# Patient Record
Sex: Male | Born: 1995 | Race: White | Hispanic: No | Marital: Single | Smoking: Current every day smoker
Health system: Southern US, Community
[De-identification: ages and names within clinical notes are randomized; demographics above are authoritative.]

## PROBLEM LIST (undated history)

## (undated) DIAGNOSIS — J45909 Unspecified asthma, uncomplicated: Secondary | ICD-10-CM

## (undated) DIAGNOSIS — R569 Unspecified convulsions: Secondary | ICD-10-CM

## (undated) HISTORY — DX: Unspecified convulsions: R56.9

---
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
## 2014-11-25 ENCOUNTER — Emergency Department (HOSPITAL_COMMUNITY)
Admission: EM | Admit: 2014-11-25 | Discharge: 2014-11-25 | Disposition: A | Discharge status: Home / Self Care | Payer: Self-pay | Attending: Emergency Medicine | Admitting: Emergency Medicine

## 2014-11-25 ENCOUNTER — Emergency Department (HOSPITAL_COMMUNITY): Payer: Self-pay

## 2014-11-25 ENCOUNTER — Encounter (HOSPITAL_COMMUNITY): Payer: Self-pay | Admitting: Emergency Medicine

## 2014-11-25 DIAGNOSIS — R0981 Nasal congestion: Secondary | ICD-10-CM

## 2014-11-25 DIAGNOSIS — J45909 Unspecified asthma, uncomplicated: Secondary | ICD-10-CM | POA: Insufficient documentation

## 2014-11-25 DIAGNOSIS — Y999 Unspecified external cause status: Secondary | ICD-10-CM | POA: Insufficient documentation

## 2014-11-25 DIAGNOSIS — R600 Localized edema: Secondary | ICD-10-CM | POA: Insufficient documentation

## 2014-11-25 DIAGNOSIS — Y9389 Activity, other specified: Secondary | ICD-10-CM | POA: Insufficient documentation

## 2014-11-25 DIAGNOSIS — X58XXXA Exposure to other specified factors, initial encounter: Secondary | ICD-10-CM | POA: Insufficient documentation

## 2014-11-25 DIAGNOSIS — J029 Acute pharyngitis, unspecified: Secondary | ICD-10-CM | POA: Insufficient documentation

## 2014-11-25 DIAGNOSIS — T171XXA Foreign body in nostril, initial encounter: Secondary | ICD-10-CM

## 2014-11-25 DIAGNOSIS — Y9289 Other specified places as the place of occurrence of the external cause: Secondary | ICD-10-CM | POA: Insufficient documentation

## 2014-11-25 HISTORY — DX: Unspecified asthma, uncomplicated: J45.909

## 2014-11-25 LAB — RAPID STREP SCREEN (MED CTR MEBANE ONLY): STREPTOCOCCUS, GROUP A SCREEN (DIRECT): NEGATIVE

## 2014-11-25 NOTE — Discharge Instructions (Signed)
Nasal Foreign Body A nasal foreign body is any object inserted inside the nose. Small children often insert small objects in the nose such as beads, coins, and small toys. Older children and adults may also accidentally get an object stuck inside the nose. Having a foreign body in the nose can cause serious medical problems. It may cause trouble breathing. If the object is swallowed and obstructs the esophagus, it can cause difficulty swallowing. A nasal foreign body often causes bleeding of the nose. Depending on the type of object, irritation in the nose may also occur. This can be more serious with certain objects, such as button batteries, magnets, and wooden objects. A foreign body may also cause thick, yellowish, or bad smelling drainage from the nose, as well as pain in the nose and face. These problems can be signs of infection. Nasal foreign bodies require immediate evaluation by a medical professional.  HOME CARE INSTRUCTIONS   Do not try to remove the object without getting medical advice. Trying to grab the object may push it deeper and make it more difficult to remove.  Breathe through the mouth until you can see your caregiver. This helps prevent inhalation of the object.  Keep small objects out of reach of young children.  Tell your child not to put objects into his or her nose. Tell your child to get help from an adult right away if it happens again. SEEK MEDICAL CARE IF:   There is any trouble breathing.  There is sudden difficulty swallowing, increased drooling, or new chest pain.  There is any bleeding from the nose.  The nose continues to drain. An object may still be in the nose.  A fever, earache, headache, pain in the cheeks or around the eyes, or yellow-green nasal discharge develops. These are signs of a possible sinus infection or ear infection from obstruction of the normal nasal airway. MAKE SURE YOU:  Understand these instructions.  Will watch your  condition.  Will get help right away if you are not doing well or get worse.   This information is not intended to replace advice given to you by your health care provider. Make sure you discuss any questions you have with your health care provider.   Follow up with the Ear, Nose and Throat doctor as soon as possible for evaluation of possible foreign body in your nose. Continue taking OTC allergy medication. Encourage nasal rinsing, netty pot use. Return to the Emergency Department if you experience severe nose bleeding, difficulty breathing, confusion, fever, difficulty swallowing, blood in vomit or stool.

## 2014-11-25 NOTE — ED Provider Notes (Signed)
CSN: [id number]     Arrival date & time 11/25/14  1519 History   First MD Initiated Contact with Patient 11/25/14 1805     Chief Complaint  Patient presents with  . Facial Swelling    The patient said he started having congestion sunday morning and he sneezed and a "long string" came out.  He also said his face is swollen.  He said he googled it and he found out  that the "long strings" might be a parasite.  . Nasal Congestion     (Consider location/radiation/quality/duration/timing/severity/associated sxs/prior Treatment) HPI  Eric Arellano is a 19 y.o M with a pmhx of hay fever who presents to the Emergency Department c/o a foreign body in his nose. Pt states that 5 days ago he began sneezing and experiencing congestion. Pt was taking OTC allergy medication with no relief. Pt states that when he blows his nose a "long string" came out. Pt states that he looked it up on google and he thinks it may be a parasite. Pt states he has seen approximately 20 of these strings come out of his nose. The strings are clear, they do not move. Denies recent travel, drinking unfiltered water, vomiting, diarrhea, melena, hematichezia, dysuria, cough, SOB, headache, blurry vision, fever.  Past Medical History  Diagnosis Date  . Asthma    History reviewed. No pertinent past surgical history. History reviewed. No pertinent family history. Social History  Substance Use Topics  . Smoking status: Never Smoker   . Smokeless tobacco: None  . Alcohol Use: No    Review of Systems  All other systems reviewed and are negative.     Allergies  Review of patient's allergies indicates not on file.  Home Medications   Prior to Admission medications   Not on File   BP 135/90 mmHg  Pulse 66  Temp(Src) 98.2 F (36.8 C) (Oral)  Resp 16  Ht  (1.651 m)  Wt 150 lb (68.04 kg)  BMI 24.96 kg/m2  SpO2 98% Physical Exam  Constitutional: He is oriented to person, place, and time. He appears well-developed  and well-nourished. No distress.  HENT:  Head: Normocephalic and atraumatic.  Nose: Mucosal edema and rhinorrhea present. No sinus tenderness or nasal deformity. No epistaxis.  No foreign bodies.  Mouth/Throat: Oropharynx is clear and moist. No oropharyngeal exudate.  No evidence of foreign body seen on clinical exam. Nares boggy, but patent.  Eyes: Conjunctivae are normal. Right eye exhibits no discharge. Left eye exhibits no discharge. No scleral icterus.  Neck: Neck supple.  Cardiovascular: Normal rate and normal heart sounds.  Exam reveals no gallop and no friction rub.   No murmur heard. Pulmonary/Chest: Effort normal and breath sounds normal. No respiratory distress. He has no wheezes. He has no rales. He exhibits no tenderness.  Lymphadenopathy:    He has no cervical adenopathy.  Neurological: He is alert and oriented to person, place, and time. Coordination normal.  Skin: Skin is warm and dry. No rash noted. He is not diaphoretic. No erythema. No pallor.  Psychiatric: He has a normal mood and affect. His behavior is normal.  Nursing note and vitals reviewed.   ED Course  Procedures (including critical care time) Labs Review Labs Reviewed  RAPID STREP SCREEN (NOT AT [facility])  CULTURE, GROUP A STREP    Imaging Review Dg Chest 2 View  11/25/2014  CLINICAL DATA:  Fever and congestion for 3 days EXAM: CHEST  2 VIEW COMPARISON:  None. FINDINGS: Lungs are  clear. Heart size and pulmonary vascularity are normal. No adenopathy. No bone lesions. IMPRESSION: No abnormality noted. Electronically Signed   By: Bretta BangWilliam  Woodruff III M.D.   On: 11/25/2014 16:10   I have personally reviewed and evaluated these images and lab results as part of my medical decision-making.   EKG Interpretation None      MDM   Final diagnoses:  Nasal congestion  Foreign body in nose, initial encounter   Otherwise healthy 19 y.o M presents with congestion, possible foreign body in nose. Pt having  allergy-like symptoms including congestion and sneezing. When blowing his nose patient noticed a long string on his tissue. Patient concerned that this may be a parasite. Patient took a photo of what came out of his nose. I reviewed the photo and the foreign body in question appears to be mucus. No evidence of foreign body seen on clinical exam. We'll refer patient to ENT for follow-up evaluation. Patient encouraged to continue taking over-the-counter decongestants and nasal rinsing.   Return precautions outlined in patient discharge instructions.      Lester KinsmanSamantha Tripp DarrowDowless, PA-C 11/26/14 [id number]  Azalia BilisKevin Campos, MD 11/26/14 0030

## 2014-11-25 NOTE — ED Notes (Signed)
The patient said he started having congestion sunday morning and he sneezed and a "long string" came out.  He also said his face is swollen.  He said he googled it and he found out  that the "long strings" might be a parasite.  He is complaining of a sore throat.  He did take some allergy medication and it is not working.

## 2014-11-27 LAB — CULTURE, GROUP A STREP: Strep A Culture: NEGATIVE

## 2014-12-15 ENCOUNTER — Emergency Department (HOSPITAL_COMMUNITY)
Admission: EM | Admit: 2014-12-15 | Discharge: 2014-12-15 | Disposition: A | Discharge status: Home / Self Care | Payer: Self-pay | Attending: Physician Assistant | Admitting: Physician Assistant

## 2014-12-15 ENCOUNTER — Encounter (HOSPITAL_COMMUNITY): Payer: Self-pay

## 2014-12-15 DIAGNOSIS — Y9289 Other specified places as the place of occurrence of the external cause: Secondary | ICD-10-CM | POA: Insufficient documentation

## 2014-12-15 DIAGNOSIS — X58XXXA Exposure to other specified factors, initial encounter: Secondary | ICD-10-CM | POA: Insufficient documentation

## 2014-12-15 DIAGNOSIS — Y998 Other external cause status: Secondary | ICD-10-CM | POA: Insufficient documentation

## 2014-12-15 DIAGNOSIS — Y9389 Activity, other specified: Secondary | ICD-10-CM | POA: Insufficient documentation

## 2014-12-15 DIAGNOSIS — S39012A Strain of muscle, fascia and tendon of lower back, initial encounter: Secondary | ICD-10-CM | POA: Insufficient documentation

## 2014-12-15 DIAGNOSIS — J45909 Unspecified asthma, uncomplicated: Secondary | ICD-10-CM | POA: Insufficient documentation

## 2014-12-15 DIAGNOSIS — F17210 Nicotine dependence, cigarettes, uncomplicated: Secondary | ICD-10-CM | POA: Insufficient documentation

## 2014-12-15 MED ORDER — MELOXICAM 15 MG PO TABS: 15.0000 mg | ORAL_TABLET | Freq: Every day | ORAL | Status: DC

## 2014-12-15 MED ORDER — NAPROXEN 250 MG PO TABS
500.0000 mg | ORAL_TABLET | Freq: Once | ORAL | Status: AC
Start: 2014-12-15 — End: 2014-12-15
  Administered 2014-12-15: 500 mg via ORAL
  Filled 2014-12-15: qty 2

## 2014-12-15 MED ORDER — CYCLOBENZAPRINE HCL 10 MG PO TABS: 10.0000 mg | ORAL_TABLET | Freq: Two times a day (BID) | ORAL | Status: DC | PRN

## 2014-12-15 MED ORDER — CYCLOBENZAPRINE HCL 10 MG PO TABS
10.0000 mg | ORAL_TABLET | Freq: Once | ORAL | Status: AC
  Administered 2014-12-15: 10 mg via ORAL
  Filled 2014-12-15: qty 1

## 2014-12-15 NOTE — Discharge Instructions (Signed)
Take mobic and and flexeril as needed for pain. Refer to attached documents for more information. Return to the ED with worsening or concerning symptoms.

## 2014-12-15 NOTE — ED Notes (Signed)
Per Pt, Patient was leaving work last night and bent over when he then had sudden pain and was unable to stand back up. Patient denies any pain in spine, arms and legs. Denies any nausea, vomiting, diarrhea.

## 2014-12-15 NOTE — ED Notes (Signed)
See PA Assessment  

## 2014-12-15 NOTE — ED Provider Notes (Signed)
CSN: [id number]     Arrival date & time 12/15/14  [id number] History   First MD Initiated Contact with Patient 12/15/14 [phone]     Chief Complaint  Patient presents with  . Back Pain     (Consider location/radiation/quality/duration/timing/severity/associated sxs/prior Treatment) HPI Comments: Patient is a 19 year old male who presents with sudden onset of lower back pain that started . The pain is sharp and severe and does not radiate. The pain is constant. Movement makes the pain worse. Nothing makes the pain better. Patient has not tried anything for pain. No associated symptoms. No saddles paresthesias or bladder/bowel incontinence.      Past Medical History  Diagnosis Date  . Asthma    History reviewed. No pertinent past surgical history. No family history on file. Social History  Substance Use Topics  . Smoking status: Current Every Day Smoker -- 0.50 packs/day    Types: Cigarettes  . Smokeless tobacco: Never Used  . Alcohol Use: No    Review of Systems  Musculoskeletal: Positive for back pain.  All other systems reviewed and are negative.     Allergies  Review of patient's allergies indicates no known allergies.  Home Medications   Prior to Admission medications   Not on File   BP 129/74 mmHg  Pulse 92  Temp(Src) 98.1 F (36.7 C) (Oral)  Resp 16  Wt 69.219 kg  SpO2 97% Physical Exam  Constitutional: He is oriented to person, place, and time. He appears well-developed and well-nourished. No distress.  HENT:  Head: Normocephalic and atraumatic.  Eyes: Conjunctivae and EOM are normal.  Neck: Normal range of motion.  Cardiovascular: Normal rate and regular rhythm.  Exam reveals no gallop and no friction rub.   No murmur heard. Pulmonary/Chest: Effort normal and breath sounds normal. He has no wheezes. He has no rales. He exhibits no tenderness.  Abdominal: Soft. He exhibits no distension. There is no tenderness. There is no rebound.  Musculoskeletal: Normal  range of motion.  No midline spine tenderness to palpation. Left lumbar paraspinal tenderness to palpation.   Neurological: He is alert and oriented to person, place, and time. Coordination normal.  Speech is goal-oriented. Moves limbs without ataxia. Lower extremity strength and sensation equal and intact bilaterally.   Skin: Skin is warm and dry.  Psychiatric: He has a normal mood and affect. His behavior is normal.  Nursing note and vitals reviewed.   ED Course  Procedures (including critical care time) Labs Review Labs Reviewed - No data to display  Imaging Review No results found. I have personally reviewed and evaluated these images and lab results as part of my medical decision-making.   EKG Interpretation None      MDM   Final diagnoses:  Strain of lumbar paraspinal muscle, initial encounter    8:45 AM Patient likely has a muscle strain and will be treated with naprosyn and flexeril. No neuro deficits. Patient will have heat packs here. Vitals stable and patient afebrile. No bladder/bowel incontinence or saddle paresthesias.    Emilia BeckKaitlyn Anissa Abbs, PA-C 12/15/14 1012  Courteney Randall AnLyn Mackuen, MD 12/15/14 1535

## 2015-02-02 ENCOUNTER — Encounter (HOSPITAL_COMMUNITY): Payer: Self-pay

## 2015-02-02 ENCOUNTER — Emergency Department (HOSPITAL_COMMUNITY)
Admission: EM | Admit: 2015-02-02 | Discharge: 2015-02-03 | Disposition: A | Discharge status: Home / Self Care | Payer: Self-pay | Attending: Emergency Medicine | Admitting: Emergency Medicine

## 2015-02-02 ENCOUNTER — Emergency Department (HOSPITAL_COMMUNITY): Payer: Self-pay

## 2015-02-02 DIAGNOSIS — R05 Cough: Secondary | ICD-10-CM | POA: Insufficient documentation

## 2015-02-02 DIAGNOSIS — R509 Fever, unspecified: Secondary | ICD-10-CM | POA: Insufficient documentation

## 2015-02-02 DIAGNOSIS — R1110 Vomiting, unspecified: Secondary | ICD-10-CM | POA: Insufficient documentation

## 2015-02-02 DIAGNOSIS — J45909 Unspecified asthma, uncomplicated: Secondary | ICD-10-CM | POA: Insufficient documentation

## 2015-02-02 DIAGNOSIS — Z791 Long term (current) use of non-steroidal anti-inflammatories (NSAID): Secondary | ICD-10-CM | POA: Insufficient documentation

## 2015-02-02 DIAGNOSIS — M791 Myalgia: Secondary | ICD-10-CM | POA: Insufficient documentation

## 2015-02-02 DIAGNOSIS — F17210 Nicotine dependence, cigarettes, uncomplicated: Secondary | ICD-10-CM | POA: Insufficient documentation

## 2015-02-02 DIAGNOSIS — J029 Acute pharyngitis, unspecified: Secondary | ICD-10-CM | POA: Insufficient documentation

## 2015-02-02 DIAGNOSIS — R6889 Other general symptoms and signs: Secondary | ICD-10-CM

## 2015-02-02 LAB — CBC WITH DIFFERENTIAL/PLATELET
BASOS ABS: 0 10*3/uL (ref 0.0–0.1)
Basophils Relative: 0 %
Eosinophils Absolute: 0 10*3/uL (ref 0.0–0.7)
Eosinophils Relative: 0 %
HEMATOCRIT: 39.4 % (ref 39.0–52.0)
HEMOGLOBIN: 13.4 g/dL (ref 13.0–17.0)
LYMPHS PCT: 9 %
Lymphs Abs: 0.8 10*3/uL (ref 0.7–4.0)
MCH: 30.7 pg (ref 26.0–34.0)
MCHC: 34 g/dL (ref 30.0–36.0)
MCV: 90.2 fL (ref 78.0–100.0)
Monocytes Absolute: 1.5 10*3/uL — ABNORMAL HIGH (ref 0.1–1.0)
Monocytes Relative: 17 %
NEUTROS ABS: 6.3 10*3/uL (ref 1.7–7.7)
Neutrophils Relative %: 74 %
Platelets: 221 10*3/uL (ref 150–400)
RBC: 4.37 MIL/uL (ref 4.22–5.81)
RDW: 12.8 % (ref 11.5–15.5)
WBC: 8.6 10*3/uL (ref 4.0–10.5)

## 2015-02-02 LAB — COMPREHENSIVE METABOLIC PANEL
ALBUMIN: 4.3 g/dL (ref 3.5–5.0)
ALT: 15 U/L — ABNORMAL LOW (ref 17–63)
ANION GAP: 12 (ref 5–15)
AST: 19 U/L (ref 15–41)
Alkaline Phosphatase: 84 U/L (ref 38–126)
BILIRUBIN TOTAL: 0.7 mg/dL (ref 0.3–1.2)
BUN: 11 mg/dL (ref 6–20)
CHLORIDE: 103 mmol/L (ref 101–111)
CO2: 20 mmol/L — ABNORMAL LOW (ref 22–32)
Calcium: 9.3 mg/dL (ref 8.9–10.3)
Creatinine, Ser: 0.93 mg/dL (ref 0.61–1.24)
GFR calc Af Amer: >60 mL/min (ref >60)
GFR calc non Af Amer: >60 mL/min (ref >60)
GLUCOSE: 93 mg/dL (ref 65–99)
POTASSIUM: 4 mmol/L (ref 3.5–5.1)
Sodium: 135 mmol/L (ref 135–145)
TOTAL PROTEIN: 7 g/dL (ref 6.5–8.1)

## 2015-02-02 LAB — URINE MICROSCOPIC-ADD ON

## 2015-02-02 LAB — URINALYSIS, ROUTINE W REFLEX MICROSCOPIC
Bilirubin Urine: NEGATIVE
Glucose, UA: NEGATIVE mg/dL
Hgb urine dipstick: NEGATIVE
Ketones, ur: >80 mg/dL — AB
LEUKOCYTES UA: NEGATIVE
Nitrite: NEGATIVE
PROTEIN: 30 mg/dL — AB
Specific Gravity, Urine: 1.031 — ABNORMAL HIGH (ref 1.005–1.030)
pH: 8 (ref 5.0–8.0)

## 2015-02-02 LAB — I-STAT CG4 LACTIC ACID, ED: LACTIC ACID, VENOUS: 0.71 mmol/L (ref 0.5–2.0)

## 2015-02-02 MED ORDER — ACETAMINOPHEN 325 MG PO TABS
650.0000 mg | ORAL_TABLET | Freq: Once | ORAL | Status: AC | PRN
  Administered 2015-02-02: 650 mg via ORAL

## 2015-02-02 MED ORDER — SODIUM CHLORIDE 0.9 % IV BOLUS (SEPSIS): 1000.0000 mL | Freq: Once | INTRAVENOUS | Status: DC

## 2015-02-02 MED ORDER — ONDANSETRON HCL 4 MG/2ML IJ SOLN: 4.0000 mg | Freq: Once | INTRAMUSCULAR | Status: DC

## 2015-02-02 MED ORDER — ACETAMINOPHEN 325 MG PO TABS
ORAL_TABLET | ORAL | Status: AC
  Filled 2015-02-02: qty 2

## 2015-02-02 NOTE — ED Notes (Signed)
Patient not in room

## 2015-02-02 NOTE — ED Notes (Signed)
Pt started having body aches, vomiting and running a fever two days ago. Denies any diarrhea. Has taken dayquil before lunch.

## 2015-02-02 NOTE — ED Provider Notes (Signed)
CSN: [id number]     Arrival date & time 02/02/15  1901 History   By signing my name below, I, Freida Busman, attest that this documentation has been prepared under the direction and in the presence of Shon Baton, MD . Electronically Signed: Freida Busman, Scribe. 02/03/2015. 12:21 AM.     Chief Complaint  Patient presents with  . Emesis  . Fever  . Generalized Body Aches     The history is provided by the patient. No language interpreter was used.     HPI Comments:  Eric Arellano is a 20 y.o. male who presents to the Emergency Department complaining of persistent cough x 3 days. He reports associated sore throat, 3 episodes of vomiting yesterday, none today,  generalized body aches and chills at home. He reports sick contacts with similar symptoms. He has taken dayquil without relief and tylenol with moderate relief of pain. No flu shot this season. He denies abdominal pain. Pt is a current smoker.   Past Medical History  Diagnosis Date  . Asthma    History reviewed. No pertinent past surgical history. No family history on file. Social History  Substance Use Topics  . Smoking status: Current Every Day Smoker -- 0.50 packs/day    Types: Cigarettes  . Smokeless tobacco: Never Used  . Alcohol Use: No    Review of Systems  Constitutional: Positive for chills.  HENT: Positive for sore throat.   Respiratory: Positive for cough. Negative for shortness of breath.   Cardiovascular: Negative for chest pain.  Gastrointestinal: Positive for vomiting.  Musculoskeletal: Positive for myalgias (generalized).  All other systems reviewed and are negative.  Allergies  Review of patient's allergies indicates no known allergies.  Home Medications   Prior to Admission medications   Medication Sig Start Date End Date Taking? Authorizing Provider  cyclobenzaprine (FLEXERIL) 10 MG tablet Take 1 tablet (10 mg total) by mouth 2 (two) times daily as needed for muscle spasms. 12/15/14    Kaitlyn Szekalski, PA-C  ibuprofen (ADVIL,MOTRIN) 600 MG tablet Take 1 tablet (600 mg total) by mouth every 6 (six) hours as needed. 02/03/15   Shon Baton, MD  meloxicam (MOBIC) 15 MG tablet Take 1 tablet (15 mg total) by mouth daily. 12/15/14   Kaitlyn Szekalski, PA-C  ondansetron (ZOFRAN ODT) 4 MG disintegrating tablet Take 1 tablet (4 mg total) by mouth every 8 (eight) hours as needed for nausea or vomiting. 02/03/15   Shon Baton, MD   BP 124/73 mmHg  Pulse 91  Temp(Src) 99 F (37.2 C) (Oral)  Resp 18  Ht  (1.651 m)  Wt 159 lb 1 oz (72.15 kg)  BMI 26.47 kg/m2  SpO2 94% Physical Exam  Constitutional: He is oriented to person, place, and time. He appears well-developed and well-nourished. No distress.  HENT:  Head: Normocephalic and atraumatic.  Mild posterior oropharyngeal edema, no tonsillar exudate, no significant tonsillar enlargement, uvula midline  Eyes: Pupils are equal, round, and reactive to light.  Neck: Normal range of motion. Neck supple.  Cardiovascular: Normal rate, regular rhythm and normal heart sounds.   No murmur heard. Pulmonary/Chest: Effort normal and breath sounds normal. No respiratory distress. He has no wheezes.  Abdominal: Soft. Bowel sounds are normal. There is no tenderness. There is no rebound.  Musculoskeletal: He exhibits no edema.  Neurological: He is alert and oriented to person, place, and time.  Skin: Skin is warm and dry. No rash noted.  Psychiatric: He has a  normal mood and affect.  Nursing note and vitals reviewed.   ED Course  Procedures   DIAGNOSTIC STUDIES:  Oxygen Saturation is 93% on RA, adequate by my interpretation.    COORDINATION OF CARE:  12:14 AM Discussed treatment plan with pt at bedside and pt agreed to plan.  Labs Review Labs Reviewed  COMPREHENSIVE METABOLIC PANEL - Abnormal; Notable for the following:    CO2 20 (*)    ALT 15 (*)    All other components within normal limits  URINALYSIS, ROUTINE W  REFLEX MICROSCOPIC (NOT AT [facility]) - Abnormal; Notable for the following:    APPearance TURBID (*)    Specific Gravity, Urine 1.031 (*)    Ketones, ur >80 (*)    Protein, ur 30 (*)    All other components within normal limits  CBC WITH DIFFERENTIAL/PLATELET - Abnormal; Notable for the following:    Monocytes Absolute 1.5 (*)    All other components within normal limits  URINE MICROSCOPIC-ADD ON - Abnormal; Notable for the following:    Squamous Epithelial / LPF 0-5 (*)    Bacteria, UA FEW (*)    Casts GRANULAR CAST (*)    All other components within normal limits  RAPID STREP SCREEN (NOT AT [facility])  CULTURE, BLOOD (ROUTINE X 2)  CULTURE, BLOOD (ROUTINE X 2)  URINE CULTURE  CULTURE, GROUP A STREP ([facility])  I-STAT CG4 LACTIC ACID, ED  I-STAT CG4 LACTIC ACID, ED    Imaging Review Dg Chest 2 View  02/02/2015  CLINICAL DATA:  Fever and cough for 3 days EXAM: CHEST  2 VIEW COMPARISON:  November 25, 2014 FINDINGS: Lungs are clear. The heart size and pulmonary vascularity are normal. No adenopathy. No bone lesions. IMPRESSION: No edema or consolidation. Electronically Signed   By: Bretta Bang III M.D.   On: 02/02/2015 19:59   I have personally reviewed and evaluated these images and lab results as part of my medical decision-making.   EKG Interpretation None      MDM   Final diagnoses:  Flu-like symptoms    Patient presents with flulike symptoms.  Symptoms ongoing for last 3 days. Febrile to 102 in triage. Given cough and additional symptoms, pharyngitis is likely viral. Strep screen is negative. Basic labwork obtained and largely reassuring.  He does have 80 ketones in the urine. He is declining IV fluids at this time. He was given Zofran and was able to orally hydrate. Discussed with patient supportive care at home including Tylenol and Motrin for fever and bodyaches as well as Zofran for nausea. He was given strict return precautions.  After history, exam, and medical workup I  feel the patient has been appropriately medically screened and is safe for discharge home. Pertinent diagnoses were discussed with the patient. Patient was given return precautions.  I personally performed the services described in this documentation, which was scribed in my presence. The recorded information has been reviewed and is accurate.   Shon Baton, MD 02/03/15 [phone]

## 2015-02-03 LAB — URINE CULTURE

## 2015-02-03 LAB — RAPID STREP SCREEN (MED CTR MEBANE ONLY): STREPTOCOCCUS, GROUP A SCREEN (DIRECT): NEGATIVE

## 2015-02-03 MED ORDER — ONDANSETRON 4 MG PO TBDP: 4.0000 mg | ORAL_TABLET | Freq: Three times a day (TID) | ORAL | Status: DC | PRN

## 2015-02-03 MED ORDER — IBUPROFEN 400 MG PO TABS
600.0000 mg | ORAL_TABLET | Freq: Once | ORAL | Status: AC
  Administered 2015-02-03: 600 mg via ORAL
  Filled 2015-02-03: qty 1

## 2015-02-03 MED ORDER — IBUPROFEN 600 MG PO TABS: 600.0000 mg | ORAL_TABLET | Freq: Four times a day (QID) | ORAL | Status: DC | PRN

## 2015-02-03 MED ORDER — ONDANSETRON 4 MG PO TBDP
4.0000 mg | ORAL_TABLET | Freq: Once | ORAL | Status: AC
  Administered 2015-02-03: 4 mg via ORAL
  Filled 2015-02-03: qty 1

## 2015-02-03 NOTE — ED Notes (Signed)
Drink provided as po challenge.

## 2015-02-03 NOTE — Discharge Instructions (Signed)
Viral Infections °A viral infection can be caused by different types of viruses. Most viral infections are not serious and resolve on their own. However, some infections may cause severe symptoms and may lead to further complications. °SYMPTOMS °Viruses can frequently cause: °· Minor sore throat. °· Aches and pains. °· Headaches. °· Runny nose. °· Different types of rashes. °· Watery eyes. °· Tiredness. °· Cough. °· Loss of appetite. °· Gastrointestinal infections, resulting in nausea, vomiting, and diarrhea. °These symptoms do not respond to antibiotics because the infection is not caused by bacteria. However, you might catch a bacterial infection following the viral infection. This is sometimes called a "superinfection." Symptoms of such a bacterial infection may include: °· Worsening sore throat with pus and difficulty swallowing. °· Swollen neck glands. °· Chills and a high or persistent fever. °· Severe headache. °· Tenderness over the sinuses. °· Persistent overall ill feeling (malaise), muscle aches, and tiredness (fatigue). °· Persistent cough. °· Yellow, green, or brown mucus production with coughing. °HOME CARE INSTRUCTIONS  °· Only take over-the-counter or prescription medicines for pain, discomfort, diarrhea, or fever as directed by your caregiver. °· Drink enough water and fluids to keep your urine clear or pale yellow. Sports drinks can provide valuable electrolytes, sugars, and hydration. °· Get plenty of rest and maintain proper nutrition. Soups and broths with crackers or rice are fine. °SEEK IMMEDIATE MEDICAL CARE IF:  °· You have severe headaches, shortness of breath, chest pain, neck pain, or an unusual rash. °· You have uncontrolled vomiting, diarrhea, or you are unable to keep down fluids. °· You or your child has an oral temperature above 102° F (38.9° C), not controlled by medicine. °· Your baby is older than 3 months with a rectal temperature of 102° F (38.9° C) or higher. °· Your baby is 3  months old or younger with a rectal temperature of 100.4° F (38° C) or higher. °MAKE SURE YOU:  °· Understand these instructions. °· Will watch your condition. °· Will get help right away if you are not doing well or get worse. °  °This information is not intended to replace advice given to you by your health care provider. Make sure you discuss any questions you have with your health care provider. °  °Document Released: 10/12/2004 Document Revised: 03/27/2011 Document Reviewed: 06/10/2014 °Elsevier Interactive Patient Education ©2016 [employer] ° °

## 2015-02-03 NOTE — ED Notes (Signed)
Discussed patient with dr. Wilkie Aye, patient declined fluids and prefers po zofran. Md allows po zofran and fluid challenge.

## 2015-02-03 NOTE — ED Notes (Signed)
Called dr. Wilkie Aye to request more pain meds. Reviewed patient has already received tylenol. She acknowledges, gives verbal order for  of motrin.

## 2015-02-05 LAB — CULTURE, GROUP A STREP (THRC)

## 2015-02-07 LAB — CULTURE, BLOOD (ROUTINE X 2)
CULTURE: NO GROWTH
Culture: NO GROWTH

## 2015-09-10 ENCOUNTER — Emergency Department
Admission: EM | Admit: 2015-09-10 | Discharge: 2015-09-10 | Disposition: A | Discharge status: Home / Self Care | Payer: Self-pay | Attending: Emergency Medicine | Admitting: Emergency Medicine

## 2015-09-10 ENCOUNTER — Emergency Department: Payer: Self-pay

## 2015-09-10 DIAGNOSIS — S0990XA Unspecified injury of head, initial encounter: Secondary | ICD-10-CM

## 2015-09-10 DIAGNOSIS — Z23 Encounter for immunization: Secondary | ICD-10-CM | POA: Insufficient documentation

## 2015-09-10 DIAGNOSIS — Y929 Unspecified place or not applicable: Secondary | ICD-10-CM | POA: Insufficient documentation

## 2015-09-10 DIAGNOSIS — Y939 Activity, unspecified: Secondary | ICD-10-CM | POA: Insufficient documentation

## 2015-09-10 DIAGNOSIS — Y990 Civilian activity done for income or pay: Secondary | ICD-10-CM | POA: Insufficient documentation

## 2015-09-10 DIAGNOSIS — F17210 Nicotine dependence, cigarettes, uncomplicated: Secondary | ICD-10-CM | POA: Insufficient documentation

## 2015-09-10 DIAGNOSIS — J45909 Unspecified asthma, uncomplicated: Secondary | ICD-10-CM | POA: Insufficient documentation

## 2015-09-10 DIAGNOSIS — W1809XA Striking against other object with subsequent fall, initial encounter: Secondary | ICD-10-CM | POA: Insufficient documentation

## 2015-09-10 DIAGNOSIS — S0101XA Laceration without foreign body of scalp, initial encounter: Secondary | ICD-10-CM | POA: Insufficient documentation

## 2015-09-10 DIAGNOSIS — S060X1A Concussion with loss of consciousness of 30 minutes or less, initial encounter: Secondary | ICD-10-CM | POA: Insufficient documentation

## 2015-09-10 DIAGNOSIS — W19XXXA Unspecified fall, initial encounter: Secondary | ICD-10-CM

## 2015-09-10 LAB — BASIC METABOLIC PANEL
Anion gap: 8 (ref 5–15)
BUN: 11 mg/dL (ref 6–20)
CALCIUM: 9.5 mg/dL (ref 8.9–10.3)
CHLORIDE: 108 mmol/L (ref 101–111)
CO2: 22 mmol/L (ref 22–32)
CREATININE: 0.69 mg/dL (ref 0.61–1.24)
Glucose, Bld: 103 mg/dL — ABNORMAL HIGH (ref 65–99)
Potassium: 3.5 mmol/L (ref 3.5–5.1)
SODIUM: 138 mmol/L (ref 135–145)

## 2015-09-10 LAB — CBC WITH DIFFERENTIAL/PLATELET
Basophils Absolute: 0.1 10*3/uL (ref 0–0.1)
Basophils Relative: 0 %
EOS PCT: 0 %
Eosinophils Absolute: 0 10*3/uL (ref 0–0.7)
HCT: 42.6 % (ref 40.0–52.0)
Hemoglobin: 14.4 g/dL (ref 13.0–18.0)
LYMPHS ABS: 1.6 10*3/uL (ref 1.0–3.6)
Lymphocytes Relative: 9 %
MCH: 31.1 pg (ref 26.0–34.0)
MCHC: 33.8 g/dL (ref 32.0–36.0)
MCV: 91.8 fL (ref 80.0–100.0)
Monocytes Absolute: 1.1 10*3/uL — ABNORMAL HIGH (ref 0.2–1.0)
Monocytes Relative: 7 %
NEUTROS PCT: 84 %
Neutro Abs: 14.5 10*3/uL — ABNORMAL HIGH (ref 1.4–6.5)
PLATELETS: 236 10*3/uL (ref 150–440)
RBC: 4.64 MIL/uL (ref 4.40–5.90)
RDW: 13.5 % (ref 11.5–14.5)
WBC: 17.3 10*3/uL — AB (ref 3.8–10.6)

## 2015-09-10 LAB — CK: Total CK: 158 U/L (ref 49–397)

## 2015-09-10 MED ORDER — LIDOCAINE-EPINEPHRINE (PF) 1 %-1:200000 IJ SOLN
INTRAMUSCULAR | Status: AC
  Administered 2015-09-10: 30 mL
  Filled 2015-09-10: qty 30

## 2015-09-10 MED ORDER — LIDOCAINE HCL (PF) 1 % IJ SOLN: 30.0000 mL | Freq: Once | INTRAMUSCULAR | Status: DC

## 2015-09-10 MED ORDER — ACETAMINOPHEN 500 MG PO TABS
1000.0000 mg | ORAL_TABLET | Freq: Once | ORAL | Status: AC
  Administered 2015-09-10: 1000 mg via ORAL
  Filled 2015-09-10: qty 2

## 2015-09-10 MED ORDER — LIDOCAINE-EPINEPHRINE (PF) 1 %-1:200000 IJ SOLN
30.0000 mL | Freq: Once | INTRAMUSCULAR | Status: AC
  Administered 2015-09-10: 30 mL

## 2015-09-10 MED ORDER — TRAMADOL HCL 50 MG PO TABS: 50.0000 mg | ORAL_TABLET | Freq: Four times a day (QID) | ORAL | 0 refills | Status: AC | PRN

## 2015-09-10 MED ORDER — SODIUM CHLORIDE 0.9 % IV BOLUS (SEPSIS)
1000.0000 mL | Freq: Once | INTRAVENOUS | Status: AC
  Administered 2015-09-10: 1000 mL via INTRAVENOUS

## 2015-09-10 MED ORDER — LIDOCAINE-EPINEPHRINE (PF) 2 %-1:200000 IJ SOLN
10.0000 mL | Freq: Once | INTRAMUSCULAR | Status: DC
  Filled 2015-09-10: qty 10

## 2015-09-10 MED ORDER — TETANUS-DIPHTH-ACELL PERTUSSIS 5-2.5-18.5 LF-MCG/0.5 IM SUSP
0.5000 mL | Freq: Once | INTRAMUSCULAR | Status: AC
  Administered 2015-09-10: 0.5 mL via INTRAMUSCULAR
  Filled 2015-09-10: qty 0.5

## 2015-09-10 NOTE — ED Provider Notes (Addendum)
[facility] Emergency Department Provider Note  ____________________________________________   I have reviewed the triage vital signs and the nursing notes.   HISTORY  Chief Complaint Head Injury    HPI Eric Arellano is a 20 y.o. male who presents to the complaining of a head injury. Patient states he works at work later, he stood up suddenly, felt lightheaded and had fallen over backwards. He remembers what happened. He thinks he did pass out. He was unconscious certainly afterwards. Patient states he bumped his jaw on the back of his head. He has no symptoms at this time he is awake and alert and oriented. No history of early cardiac death in his family, he has no prior history of syncope. States he did not have anything to drink today.      Past Medical History:  Diagnosis Date  . Asthma     There are no active problems to display for this patient.   History reviewed. No pertinent surgical history.  Prior to Admission medications   Medication Sig Start Date End Date Taking? Authorizing Provider  cyclobenzaprine (FLEXERIL) 10 MG tablet Take 1 tablet (10 mg total) by mouth 2 (two) times daily as needed for muscle spasms. 12/15/14   Kaitlyn Szekalski, PA-C  ibuprofen (ADVIL,MOTRIN) 600 MG tablet Take 1 tablet (600 mg total) by mouth every 6 (six) hours as needed. 02/03/15   Shon Batonourtney F Horton, MD  meloxicam (MOBIC) 15 MG tablet Take 1 tablet (15 mg total) by mouth daily. 12/15/14   Kaitlyn Szekalski, PA-C  ondansetron (ZOFRAN ODT) 4 MG disintegrating tablet Take 1 tablet (4 mg total) by mouth every 8 (eight) hours as needed for nausea or vomiting. 02/03/15   Shon Batonourtney F Horton, MD    Allergies Review of patient's allergies indicates no known allergies.  No family history on file.  Social History Social History  Substance Use Topics  . Smoking status: Current Every Day Smoker    Packs/day: 0.50    Types: Cigarettes  . Smokeless tobacco: Never Used   . Alcohol use No    Review of Systems Constitutional: No fever/chills Eyes: No visual changes. ENT: No sore throat. No stiff neck no neck pain Cardiovascular: Denies chest pain. Respiratory: Denies shortness of breath. Gastrointestinal:   no vomiting.  No diarrhea.  No constipation. Genitourinary: Negative for dysuria. Musculoskeletal: Negative lower extremity swelling Skin: Negative for rash. Neurological: Negative for severe headaches, focal weakness or numbness. 10-point ROS otherwise negative.  ____________________________________________   PHYSICAL EXAM:  VITAL SIGNS: ED Triage Vitals [09/10/15 1312]  Enc Vitals Group     BP 128/65     Pulse Rate 62     Resp 18     Temp 97.8 F (36.6 C)     Temp Source Oral     SpO2 98 %     Weight 150 lb (68 kg)     Height 5\' 7"  (1.702 m)     Head Circumference      Peak Flow      Pain Score 8     Pain Loc      Pain Edu?      Excl. in GC?     Constitutional: Alert and oriented. Well appearing and in no acute distress. Eyes: Conjunctivae are normal. PERRL. EOMI. Head: There is a laceration, well approximated approximately 3 cm to the occipital region with no skull fracture palpated. Nose: No congestion/rhinnorhea. Mouth/Throat: Mucous membranes are moist.  Oropharynx non-erythematous., There is minimal tenderness to  the right jaw no malocclusion or obvious evidence of intraoral pathology or lesions or trauma Neck: No stridor.   Nontender with no meningismus Cardiovascular: Normal rate, regular rhythm. Grossly normal heart sounds.  Good peripheral circulation. Respiratory: Normal respiratory effort.  No retractions. Lungs CTAB. Abdominal: Soft and nontender. No distention. No guarding no rebound Back:  There is no focal tenderness or step off.  there is no midline tenderness there are no lesions noted. there is no CVA tenderness Musculoskeletal: No lower extremity tenderness, no upper extremity tenderness. No joint effusions,  no DVT signs strong distal pulses no edema Neurologic:  Normal speech and language. No gross focal neurologic deficits are appreciated.  Skin:  Skin is warm, dry and intact. No rash noted. Psychiatric: Mood and affect are normal. Speech and behavior are normal.  ____________________________________________   LABS (all labs ordered are listed, but only abnormal results are displayed)  Labs Reviewed  BASIC METABOLIC PANEL  CBC WITH DIFFERENTIAL/PLATELET  CK   ____________________________________________  EKG  I personally interpreted any EKGs ordered by me or triage Normal sinus rhythm rate 81 bpm no acute ST elevation or acute ST depression, no evidence of Brugada syndrome, normal QT interval. ____________________________________________  RADIOLOGY  I reviewed any imaging ordered by me or triage that were performed during my shift and, if possible, patient and/or family made aware of any abnormal findings. ____________________________________________   PROCEDURES  Procedure(s) performed: LACERATION REPAIR Performed by: Jeanmarie Plant Authorized by: Jeanmarie Plant Consent: Verbal consent obtained. Risks and benefits: risks, benefits and alternatives were discussed Consent given by: patient Patient identity confirmed: provided demographic data Prepped and Draped in normal sterile fashion Wound explored  Laceration Location: Occiput  Laceration Length: 3 cm  No Foreign Bodies seen or palpated  Anesthesia: local infiltration  Local anesthetic: lidocaine 1% % with epinephrine  Anesthetic total: 5 ml  Irrigation method: syringe Amount of cleaning: standard  Skin closure: Staples   Number of sutures: 6   Technique: Staples   Patient tolerance: Patient tolerated the procedure well with no immediate complications.   Procedures  Critical Care performed: None  ____________________________________________   INITIAL IMPRESSION / ASSESSMENT AND PLAN / ED  COURSE  Pertinent labs & imaging results that were available during my care of the patient were reviewed by me and considered in my medical decision making (see chart for details).  Patient had a likely vasovagal event, he stood up and he without having anything to drink became lightheaded and fell over backwards and hit his head. He didn't pass out. At this time, he is neurologically intact. CT scan is negative. I will repair the laceration, we will evaluate for tetanus status, we will give the patient IV fluid only because he appears mildly dehydrated, we will check an EKG and basic blood work.  Clinical Course   __  Workup unremarkable patient asymptomatic, no evidence of cardiogenic syncope, no evidence of ongoing concussion symptoms. I give him concussion instructions. __________________________________________   FINAL CLINICAL IMPRESSION(S) / ED DIAGNOSES  Final diagnoses:  None      This chart was dictated using voice recognition software.  Despite best efforts to proofread,  errors can occur which can change meaning.      Jeanmarie Plant, MD 09/10/15 [id number]    Jeanmarie Plant, MD 09/10/15 [phone]

## 2015-09-10 NOTE — ED Notes (Signed)
Head wound irrigated with 50cc of water per Dr. Iline OvenMcShane's instruction.

## 2015-09-10 NOTE — ED Triage Notes (Signed)
Pt is here with coworker, states he stepped on a board and fell back and hit his head on a rock.the patient is repetitive in triage, asking same questions over and over.

## 2016-07-02 ENCOUNTER — Encounter (HOSPITAL_COMMUNITY): Payer: Self-pay | Admitting: Emergency Medicine

## 2016-07-02 ENCOUNTER — Emergency Department (HOSPITAL_COMMUNITY)
Admission: EM | Admit: 2016-07-02 | Discharge: 2016-07-02 | Disposition: A | Discharge status: Home / Self Care | Payer: Self-pay | Attending: Physician Assistant | Admitting: Physician Assistant

## 2016-07-02 DIAGNOSIS — J45909 Unspecified asthma, uncomplicated: Secondary | ICD-10-CM | POA: Insufficient documentation

## 2016-07-02 DIAGNOSIS — F17210 Nicotine dependence, cigarettes, uncomplicated: Secondary | ICD-10-CM | POA: Insufficient documentation

## 2016-07-02 DIAGNOSIS — Z79899 Other long term (current) drug therapy: Secondary | ICD-10-CM | POA: Insufficient documentation

## 2016-07-02 DIAGNOSIS — K047 Periapical abscess without sinus: Secondary | ICD-10-CM | POA: Insufficient documentation

## 2016-07-02 MED ORDER — DICLOFENAC SODIUM 75 MG PO TBEC: 75.0000 mg | DELAYED_RELEASE_TABLET | Freq: Two times a day (BID) | ORAL | 0 refills | Status: DC

## 2016-07-02 MED ORDER — AMOXICILLIN 500 MG PO CAPS: 500.0000 mg | ORAL_CAPSULE | Freq: Three times a day (TID) | ORAL | 0 refills | Status: DC

## 2016-07-02 NOTE — ED Notes (Signed)
Declined W/C at D/C and was escorted to lobby by RN. 

## 2016-07-02 NOTE — ED Provider Notes (Signed)
[facility]-EMERGENCY DEPT Provider Note   CSN: [id number] Arrival date & time: 07/02/16  1040  By signing my name below, I, Cynda Acres, attest that this documentation has been prepared under the direction and in the presence of Langston Masker, [place]. Electronically Signed: Cynda Acres, Scribe. 07/02/16. 11:52 AM.  History   Chief Complaint Chief Complaint  Patient presents with  . Dental Pain   HPI Comments: Eric Arellano is a 21 y.o. male with no pertinent past medical history, who presents to the Emergency Department complaining of sudden-onset, constant dental pain that began several days ago. Patient states he may have an infection in his wisdom teeth, which he has not had removed. Patient reports associated gum swelling. No modifying factors indicated. Patient denies any fever, chills, nausea, vomiting, discharge, or facial swelling.   The history is provided by the patient. No language interpreter was used.    Past Medical History:  Diagnosis Date  . Asthma     There are no active problems to display for this patient.   History reviewed. No pertinent surgical history.     Home Medications    Prior to Admission medications   Medication Sig Start Date End Date Taking? Authorizing Provider  cyclobenzaprine (FLEXERIL) 10 MG tablet Take 1 tablet (10 mg total) by mouth 2 (two) times daily as needed for muscle spasms. 12/15/14   Emilia Beck, PA-C  ibuprofen (ADVIL,MOTRIN) 600 MG tablet Take 1 tablet (600 mg total) by mouth every 6 (six) hours as needed. 02/03/15   Horton, Mayer Masker, MD  meloxicam (MOBIC) 15 MG tablet Take 1 tablet (15 mg total) by mouth daily. 12/15/14   Szekalski, Kaitlyn, PA-C  ondansetron (ZOFRAN ODT) 4 MG disintegrating tablet Take 1 tablet (4 mg total) by mouth every 8 (eight) hours as needed for nausea or vomiting. 02/03/15   Horton, Mayer Masker, MD  traMADol (ULTRAM) 50 MG tablet Take 1 tablet (50 mg total) by mouth every 6 (six) hours as needed.  09/10/15 09/09/16  Jeanmarie Plant, MD    Family History No family history on file.  Social History Social History  Substance Use Topics  . Smoking status: Current Every Day Smoker    Packs/day: 0.50    Types: Cigarettes  . Smokeless tobacco: Never Used  . Alcohol use No     Allergies   Patient has no known allergies.   Review of Systems Review of Systems  Constitutional: Negative for chills and fever.  HENT: Positive for dental problem.   Gastrointestinal: Negative for nausea and vomiting.  All other systems reviewed and are negative.    Physical Exam Updated Vital Signs BP 106/72 (BP Location: Left Arm)   Pulse 66   Temp 97.6 F (36.4 C) (Oral)   Resp 16   Ht 5\' 8"  (1.727 m)   Wt 168 lb 6 oz (76.4 kg)   SpO2 100%   BMI 25.60 kg/m   Physical Exam  Constitutional: He is oriented to person, place, and time. He appears well-developed.  HENT:  Head: Normocephalic and atraumatic.  Mouth/Throat: Oropharynx is clear and moist.  Impacted 3rd molar. Right lower 3rd molar severely decayed/cavity and swollen gum line.   Eyes: Conjunctivae and EOM are normal. Pupils are equal, round, and reactive to light.  Neck: Normal range of motion. Neck supple.  Cardiovascular: Normal rate.   Pulmonary/Chest: Effort normal.  Abdominal: Soft. Bowel sounds are normal.  Neurological: He is alert and oriented to person, place, and time.  Skin: Skin  is warm and dry.  Nursing note and vitals reviewed.    ED Treatments / Results  DIAGNOSTIC STUDIES: Oxygen Saturation is 100% on RA, normal by my interpretation.    COORDINATION OF CARE: 11:51 AM Discussed treatment plan with pt at bedside and pt agreed to plan, which includes amoxicillin.  Labs (all labs ordered are listed, but only abnormal results are displayed) Labs Reviewed - No data to display  EKG  EKG Interpretation None       Radiology No results found.  Procedures Procedures (including critical care  time)  Medications Ordered in ED Medications - No data to display   Initial Impression / Assessment and Plan / ED Course  I have reviewed the triage vital signs and the nursing notes.  Pertinent labs & imaging results that were available during my care of the patient were reviewed by me and considered in my medical decision making (see chart for details).       Final Clinical Impressions(s) / ED Diagnoses   Final diagnoses:  Dental infection    New Prescriptions New Prescriptions   AMOXICILLIN (AMOXIL) 500 MG CAPSULE    Take 1 capsule (500 mg total) by mouth 3 (three) times daily.   DICLOFENAC (VOLTAREN) 75 MG EC TABLET    Take 1 tablet (75 mg total) by mouth 2 (two) times daily.   An After Visit Summary was printed and given to the patient.  I personally performed the services in this documentation, which was scribed in my presence.  The recorded information has been reviewed and considered.   Barnet PallKaren SofiaPAC.     Elson AreasSofia, Klea Nall K, [place]PA-C 07/02/16 1203    Abelino DerrickMackuen, Courteney Lyn, MD 07/02/16 1504

## 2016-07-02 NOTE — ED Triage Notes (Signed)
Pt. Stated, Im having toothache problems on the rt. Back.

## 2016-08-22 ENCOUNTER — Encounter (HOSPITAL_COMMUNITY): Payer: Self-pay

## 2016-08-22 ENCOUNTER — Emergency Department (HOSPITAL_COMMUNITY)
Admission: EM | Admit: 2016-08-22 | Discharge: 2016-08-22 | Discharge status: Against Medical Advice | Payer: Self-pay | Attending: Emergency Medicine | Admitting: Emergency Medicine

## 2016-08-22 DIAGNOSIS — K0889 Other specified disorders of teeth and supporting structures: Secondary | ICD-10-CM | POA: Insufficient documentation

## 2016-08-22 NOTE — ED Notes (Signed)
Unable to locate in lobby. Called x4.

## 2016-08-22 NOTE — ED Triage Notes (Signed)
Patient complains of right sided dental pain x 3 days, denies fever, NAD

## 2017-10-17 ENCOUNTER — Emergency Department (HOSPITAL_COMMUNITY)
Admission: EM | Admit: 2017-10-17 | Discharge: 2017-10-17 | Disposition: A | Discharge status: Home / Self Care | Payer: Self-pay | Attending: Emergency Medicine | Admitting: Emergency Medicine

## 2017-10-17 DIAGNOSIS — K0889 Other specified disorders of teeth and supporting structures: Secondary | ICD-10-CM | POA: Insufficient documentation

## 2017-10-17 DIAGNOSIS — F17210 Nicotine dependence, cigarettes, uncomplicated: Secondary | ICD-10-CM | POA: Insufficient documentation

## 2017-10-17 DIAGNOSIS — J45909 Unspecified asthma, uncomplicated: Secondary | ICD-10-CM | POA: Insufficient documentation

## 2017-10-17 DIAGNOSIS — Z79899 Other long term (current) drug therapy: Secondary | ICD-10-CM | POA: Insufficient documentation

## 2017-10-17 MED ORDER — PENICILLIN V POTASSIUM 500 MG PO TABS: 500.0000 mg | ORAL_TABLET | Freq: Four times a day (QID) | ORAL | 0 refills | Status: AC

## 2017-10-17 MED ORDER — OXYCODONE-ACETAMINOPHEN 5-325 MG PO TABS
1.0000 | ORAL_TABLET | Freq: Once | ORAL | Status: AC
Start: 2017-10-17 — End: 2017-10-17
  Administered 2017-10-17: 1 via ORAL
  Filled 2017-10-17: qty 1

## 2017-10-17 MED ORDER — PENICILLIN V POTASSIUM 250 MG PO TABS
500.0000 mg | ORAL_TABLET | Freq: Once | ORAL | Status: AC
  Administered 2017-10-17: 500 mg via ORAL
  Filled 2017-10-17: qty 2

## 2017-10-17 MED ORDER — NAPROXEN 500 MG PO TABS: 500.0000 mg | ORAL_TABLET | Freq: Two times a day (BID) | ORAL | 0 refills | Status: DC

## 2017-10-17 NOTE — ED Provider Notes (Signed)
[facility] EMERGENCY DEPARTMENT Provider Note   CSN: [id number] Arrival date & time: 10/17/17  2017     History   Chief Complaint Chief Complaint  Patient presents with  . Dental Pain    HPI Eric Arellano is a 22 y.o. male with a past medical history of asthma presents to ED for 1 week history of left upper dental pain.  Describes the pain as sharp, worse with eating and radiates up to his face intermittently.  States that he has had intermittent pain associated with this tooth for the past several years since it chipped.  He has not seen a dentist in the past 8 years.  He does not have a dentist that he can follow-up with.  He has been using clove oil with mild improvement in his symptoms.  No improvement with Goody powders.  Denies any injury or trauma to the area, fever, drainage from the area, facial swelling, trouble breathing, trouble swallowing or neck pain.  HPI  Past Medical History:  Diagnosis Date  . Asthma     There are no active problems to display for this patient.   No past surgical history on file.      Home Medications    Prior to Admission medications   Medication Sig Start Date End Date Taking? Authorizing Provider  amoxicillin (AMOXIL) 500 MG capsule Take 1 capsule (500 mg total) by mouth 3 (three) times daily. 07/02/16   Elson Areas, PA-C  cyclobenzaprine (FLEXERIL) 10 MG tablet Take 1 tablet (10 mg total) by mouth 2 (two) times daily as needed for muscle spasms. 12/15/14   Emilia Beck, PA-C  diclofenac (VOLTAREN) 75 MG EC tablet Take 1 tablet (75 mg total) by mouth 2 (two) times daily. 07/02/16   Elson Areas, PA-C  ibuprofen (ADVIL,MOTRIN) 600 MG tablet Take 1 tablet (600 mg total) by mouth every 6 (six) hours as needed. 02/03/15   Horton, Mayer Masker, MD  meloxicam (MOBIC) 15 MG tablet Take 1 tablet (15 mg total) by mouth daily. 12/15/14   Szekalski, Kaitlyn, PA-C  naproxen (NAPROSYN) 500 MG tablet Take 1 tablet (500 mg  total) by mouth 2 (two) times daily. 10/17/17   Brailynn Breth, PA-C  ondansetron (ZOFRAN ODT) 4 MG disintegrating tablet Take 1 tablet (4 mg total) by mouth every 8 (eight) hours as needed for nausea or vomiting. 02/03/15   Horton, Mayer Masker, MD  penicillin v potassium (VEETID) 500 MG tablet Take 1 tablet (500 mg total) by mouth 4 (four) times daily for 7 days. 10/17/17 10/24/17  Dietrich Pates, PA-C    Family History No family history on file.  Social History Social History   Tobacco Use  . Smoking status: Current Every Day Smoker    Packs/day: 0.50    Types: Cigarettes  . Smokeless tobacco: Never Used  Substance Use Topics  . Alcohol use: No  . Drug use: No     Allergies   Patient has no known allergies.   Review of Systems Review of Systems  Constitutional: Negative for chills and fever.  HENT: Positive for dental problem. Negative for ear discharge, sore throat and trouble swallowing.   Musculoskeletal: Negative for neck pain.     Physical Exam Updated Vital Signs BP (!) 128/100 (BP Location: Right Arm)   Pulse 72   Temp 98.7 F (37.1 C) (Oral)   Resp 18   Ht 5\' 8"  (1.727 m)   Wt 70.3 kg   SpO2 100%  BMI 23.57 kg/m   Physical Exam  Constitutional: He appears well-developed and well-nourished. No distress.  HENT:  Head: Normocephalic and atraumatic.  Mouth/Throat: Uvula is midline, oropharynx is clear and moist and mucous membranes are normal. He does not have dentures. No oral lesions. No trismus in the jaw. Normal dentition. Dental caries present. No dental abscesses, uvula swelling or lacerations. No tonsillar exudate.    Tenderness to palpation over the indicated chipped tooth with decay noted.  No gross dental abscess or site of drainage at this time. No facial, neck or cheek swelling noted. No pooling of secretions or trismus.  Normal voice noted with no difficulty swallowing or breathing.  No submandibular erythema, edema or crepitus noted.  Eyes:  Conjunctivae and EOM are normal. No scleral icterus.  Neck: Normal range of motion.  Pulmonary/Chest: Effort normal. No respiratory distress.  Neurological: He is alert.  Skin: No rash noted. He is not diaphoretic.  Psychiatric: He has a normal mood and affect.  Nursing note and vitals reviewed.    ED Treatments / Results  Labs (all labs ordered are listed, but only abnormal results are displayed) Labs Reviewed - No data to display  EKG None  Radiology No results found.  Procedures Procedures (including critical care time)  Medications Ordered in ED Medications  penicillin v potassium (VEETID) tablet 500 mg (has no administration in time range)  oxyCODONE-acetaminophen (PERCOCET/ROXICET) 5-325 MG per tablet 1 tablet (has no administration in time range)     Initial Impression / Assessment and Plan / ED Course  I have reviewed the triage vital signs and the nursing notes.  Pertinent labs & imaging results that were available during my care of the patient were reviewed by me and considered in my medical decision making (see chart for details).     Patient with dentalgia. On exam, there is no evidence of a drainable abscess. No trismus, glossal elevation, unilateral tonsillar swelling. No evidence of retropharyngeal or peritonsillar abscess or Ludwig angina. Will treat with  penicillin and anti-inflammatories. Pt instructed to follow-up with dentist as soon as possible. Resource guide provided with AVS.  Portions of this note were generated with Scientist, clinical (histocompatibility and immunogenetics). Dictation errors may occur despite best attempts at proofreading.   Final Clinical Impressions(s) / ED Diagnoses   Final diagnoses:  Pain, dental    ED Discharge Orders         Ordered    penicillin v potassium (VEETID) 500 MG tablet  4 times daily     10/17/17 2059    naproxen (NAPROSYN) 500 MG tablet  2 times daily     10/17/17 2059           Dietrich Pates, PA-C 10/17/17 2100    Shaune Pollack, MD 10/20/17 2153

## 2017-10-17 NOTE — ED Triage Notes (Signed)
Pt presents with L upper dental pain that radiates up to his L eye with headache; pt states he took a goody powder without improvement of pain; states has had dental infections in the past but this is the worst one

## 2017-10-17 NOTE — ED Provider Notes (Signed)
Patient placed in Quick Look pathway, seen and evaluated   Chief Complaint: dental pain  HPI:  Eric Arellano is a 22 y.o. male who present to the ED with dental pain. The pain has been going on "for a while now". Patient c/o pain to the left side of face and left ear  ROS: ENT: dental pain  Physical Exam:  BP (!) 128/100 (BP Location: Right Arm)   Pulse 72   Temp 98.7 F (37.1 C) (Oral)   Resp 18   Ht 5\' 8"  (1.727 m)   Wt 70.3 kg   SpO2 100%   BMI 23.57 kg/m    Gen: No distress  Neuro: Awake and Alert  Skin: Warm and dry  ENT: left upper dental area with decay and tenderness    Initiation of care has begun. The patient has been counseled on the process, plan, and necessity for staying for the completion/evaluation, and the remainder of the medical screening examination    Janne Napoleon, NP 10/17/17 2033    Pricilla Loveless, MD 10/17/17 [phone]

## 2017-10-17 NOTE — Discharge Instructions (Signed)
Return to ED for worsening symptoms, trouble breathing trouble swallowing, trouble moving your neck, swelling of your face, worsening of infection.

## 2019-01-14 ENCOUNTER — Ambulatory Visit: Payer: Self-pay | Attending: Internal Medicine

## 2019-03-23 ENCOUNTER — Other Ambulatory Visit: Payer: Self-pay

## 2019-03-23 ENCOUNTER — Ambulatory Visit
Admission: EM | Admit: 2019-03-23 | Discharge: 2019-03-23 | Disposition: A | Discharge status: Home / Self Care | Payer: Self-pay | Attending: Physician Assistant | Admitting: Physician Assistant

## 2019-03-23 DIAGNOSIS — K0889 Other specified disorders of teeth and supporting structures: Secondary | ICD-10-CM

## 2019-03-23 MED ORDER — DICLOFENAC SODIUM 50 MG PO TBEC: 50.0000 mg | DELAYED_RELEASE_TABLET | Freq: Two times a day (BID) | ORAL | 0 refills | Status: DC

## 2019-03-23 MED ORDER — CHLORHEXIDINE GLUCONATE 0.12 % MT SOLN: 15.0000 mL | Freq: Two times a day (BID) | OROMUCOSAL | 0 refills | Status: DC

## 2019-03-23 MED ORDER — AMOXICILLIN-POT CLAVULANATE 875-125 MG PO TABS: 1.0000 | ORAL_TABLET | Freq: Two times a day (BID) | ORAL | 0 refills | Status: DC

## 2019-03-23 NOTE — ED Triage Notes (Addendum)
Patient presents with dental pain x one day. OTC meds not helping.  He notes having a hole in the affected tooth--right upper molar.

## 2019-03-23 NOTE — ED Provider Notes (Signed)
[name]    CSN: [id number] Arrival date & time: 03/23/19  1520      History   Chief Complaint Chief Complaint  Patient presents with  . Dental Pain    HPI Eric Arellano is a 24 y.o. male.   24 year old male comes in for 1 day history of dental pain. Known broken tooth to the right upper molar. Denies new injury/trauma. Denies facial swelling. Denies fever, chills, body aches. Goody powder/tylenol without relief.      Past Medical History:  Diagnosis Date  . Asthma     There are no problems to display for this patient.   History reviewed. No pertinent surgical history.     Home Medications    Prior to Admission medications   Medication Sig Start Date End Date Taking? Authorizing Provider  amoxicillin-clavulanate (AUGMENTIN) 875-125 MG tablet Take 1 tablet by mouth every 12 (twelve) hours. 03/23/19   Cathie Hoops, Michaele Amundson V, PA-C  chlorhexidine (PERIDEX) 0.12 % solution Use as directed 15 mLs in the mouth or throat 2 (two) times daily. 03/23/19   Cathie Hoops, Gevork Ayyad V, PA-C  diclofenac (VOLTAREN) 50 MG EC tablet Take 1 tablet (50 mg total) by mouth 2 (two) times daily. 03/23/19   Belinda Fisher, PA-C    Family History History reviewed. No pertinent family history.  Social History Social History   Tobacco Use  . Smoking status: Current Every Day Smoker    Packs/day: 0.50    Types: Cigarettes  . Smokeless tobacco: Never Used  Substance Use Topics  . Alcohol use: No  . Drug use: No     Allergies   Patient has no known allergies.   Review of Systems Review of Systems  Reason unable to perform ROS: See HPI as above.     Physical Exam Triage Vital Signs ED Triage Vitals  Enc Vitals Group     BP 03/23/19 1529 116/70     Pulse Rate 03/23/19 1529 72     Resp 03/23/19 1529 15     Temp 03/23/19 1529 97.8 F (36.6 C)     Temp Source 03/23/19 1529 Oral     SpO2 03/23/19 1529 94 %     Weight --      Height --      Head Circumference --      Peak Flow --      Pain  Score 03/23/19 1530 10     Pain Loc --      Pain Edu? --      Excl. in GC? --    No data found.  Updated Vital Signs BP 116/70 (BP Location: Left Arm)   Pulse 72   Temp 97.8 F (36.6 C) (Oral)   Resp 15   SpO2 94%   Visual Acuity Right Eye Distance:   Left Eye Distance:   Bilateral Distance:    Right Eye Near:   Left Eye Near:    Bilateral Near:     Physical Exam Constitutional:      General: He is not in acute distress.    Appearance: He is well-developed. He is not ill-appearing, toxic-appearing or diaphoretic.  HENT:     Head: Normocephalic and atraumatic.     Jaw: No trismus.     Mouth/Throat:     Mouth: Mucous membranes are moist.     Pharynx: Oropharynx is clear. Uvula midline. No uvula swelling.     Tonsils: No tonsillar exudate.      Comments:  Tenderness to palpation right posterior gumline  Floor of mouth soft to palpation. No facial swelling.  Musculoskeletal:     Cervical back: Normal range of motion and neck supple.  Skin:    General: Skin is warm and dry.  Neurological:     Mental Status: He is alert and oriented to person, place, and time.      UC Treatments / Results  Labs (all labs ordered are listed, but only abnormal results are displayed) Labs Reviewed - No data to display  EKG   Radiology No results found.  Procedures Procedures (including critical care time)  Medications Ordered in UC Medications - No data to display  Initial Impression / Assessment and Plan / UC Course  I have reviewed the triage vital signs and the nursing notes.  Pertinent labs & imaging results that were available during my care of the patient were reviewed by me and considered in my medical decision making (see chart for details).    Start antibiotics for possible dental infection. Symptomatic treatment as needed. Discussed with patient symptoms can return if dental problem is not addressed. Follow up with dentist for further evaluation and treatment of  dental pain. Resources given. Return precautions given.   Final Clinical Impressions(s) / UC Diagnoses   Final diagnoses:  Pain, dental    ED Prescriptions    Medication Sig Dispense Auth. Provider   amoxicillin-clavulanate (AUGMENTIN) 875-125 MG tablet Take 1 tablet by mouth every 12 (twelve) hours. 14 tablet Corazon Nickolas V, PA-C   diclofenac (VOLTAREN) 50 MG EC tablet Take 1 tablet (50 mg total) by mouth 2 (two) times daily. 20 tablet Jasmene Goswami V, PA-C   chlorhexidine (PERIDEX) 0.12 % solution Use as directed 15 mLs in the mouth or throat 2 (two) times daily. 120 mL Tasia Catchings, Delvon Chipps V, PA-C     I have reviewed the PDMP during this encounter.   Ok Edwards, PA-C 03/23/19 1824

## 2019-03-23 NOTE — Discharge Instructions (Signed)
Start Augmentin as directed for dental infection. Diclofenac for pain can continue tylenol. Do not take ibuprofen (motrin/advil)/ naproxen (aleve) while on diclofenac. Follow up with dentist for further treatment and evaluation. If experiencing swelling of the throat, trouble breathing, trouble swallowing, leaning forward to breath, drooling, go to the emergency department for further evaluation.

## 2019-11-02 ENCOUNTER — Encounter (HOSPITAL_COMMUNITY): Payer: Self-pay | Admitting: Emergency Medicine

## 2019-11-02 ENCOUNTER — Emergency Department (HOSPITAL_COMMUNITY)
Admission: EM | Admit: 2019-11-02 | Discharge: 2019-11-02 | Disposition: A | Discharge status: Home / Self Care | Payer: Self-pay | Attending: Emergency Medicine | Admitting: Emergency Medicine

## 2019-11-02 ENCOUNTER — Other Ambulatory Visit: Payer: Self-pay

## 2019-11-02 DIAGNOSIS — K047 Periapical abscess without sinus: Secondary | ICD-10-CM | POA: Insufficient documentation

## 2019-11-02 DIAGNOSIS — J45909 Unspecified asthma, uncomplicated: Secondary | ICD-10-CM | POA: Insufficient documentation

## 2019-11-02 DIAGNOSIS — K029 Dental caries, unspecified: Secondary | ICD-10-CM | POA: Insufficient documentation

## 2019-11-02 DIAGNOSIS — F17210 Nicotine dependence, cigarettes, uncomplicated: Secondary | ICD-10-CM | POA: Insufficient documentation

## 2019-11-02 MED ORDER — AMOXICILLIN-POT CLAVULANATE 875-125 MG PO TABS
1.0000 | ORAL_TABLET | Freq: Two times a day (BID) | ORAL | 0 refills | Status: AC
Start: 2019-11-02 — End: 2019-11-12

## 2019-11-02 NOTE — ED Triage Notes (Signed)
Pt states he was seen in [place] at hospital 1 week ago for R upper dental abscess.  States he hasn't been able to see the dentist and swelling has started back.  Denies pain.  Took amoxicillin for 1 week.

## 2019-11-02 NOTE — ED Provider Notes (Signed)
[facility] EMERGENCY DEPARTMENT Provider Note   CSN: [id number] Arrival date & time: 11/02/19  6712     History Chief Complaint  Patient presents with  . Dental Problem    Eric Arellano is a 24 y.o. male.  HPI Patient is a 24 year old male with a history of asthma no other past medical history presented today for right upper dental pain for the past several days.  He states that it is achy and constant that worse with chewing.  He states that he was seen a couple weeks ago for identical symptoms and told he had a dental abscess and was sent home with Augmentin.  He states he took it for the entire time he was prescribed and felt he had complete resolution of his symptoms.  He denies any fevers, chills, denies any difficulty opening closing his mouth.  Denies any tongue pain.     Past Medical History:  Diagnosis Date  . Asthma     There are no problems to display for this patient.   History reviewed. No pertinent surgical history.     No family history on file.  Social History   Tobacco Use  . Smoking status: Current Every Day Smoker    Packs/day: 0.50    Types: Cigarettes  . Smokeless tobacco: Never Used  Substance Use Topics  . Alcohol use: No  . Drug use: No    Home Medications Prior to Admission medications   Medication Sig Start Date End Date Taking? Authorizing Provider  amoxicillin-clavulanate (AUGMENTIN) 875-125 MG tablet Take 1 tablet by mouth every 12 (twelve) hours for 10 days. 11/02/19 11/12/19  Gailen Shelter, PA  chlorhexidine (PERIDEX) 0.12 % solution Use as directed 15 mLs in the mouth or throat 2 (two) times daily. 03/23/19   Cathie Hoops, Amy V, PA-C  diclofenac (VOLTAREN) 50 MG EC tablet Take 1 tablet (50 mg total) by mouth 2 (two) times daily. 03/23/19   Belinda Fisher, PA-C    Allergies    Patient has no known allergies.  Review of Systems   Review of Systems  Constitutional: Negative for chills and fever.  HENT: Positive for dental  problem. Negative for congestion.   Respiratory: Negative for shortness of breath.   Cardiovascular: Negative for chest pain.  Gastrointestinal: Negative for abdominal pain.  Musculoskeletal: Negative for neck pain.    Physical Exam Updated Vital Signs BP 135/74 (BP Location: Right Arm)   Pulse 77   Temp 97.8 F (36.6 C) (Oral)   Resp 17   Ht 5\' 6"  (1.676 m)   Wt 70.3 kg   SpO2 100%   BMI 25.02 kg/m   Physical Exam Vitals and nursing note reviewed.  Constitutional:      General: He is not in acute distress.    Appearance: Normal appearance. He is not ill-appearing.  HENT:     Head: Normocephalic and atraumatic.     Mouth/Throat:     Pharynx: Oropharynx is clear.      Comments: Diffusely decayed dentition.  Uvula midline.  Phonation normal.  Full range of motion of tongue.  No sublingual tenderness to palpation. Eyes:     General: No scleral icterus.       Right eye: No discharge.        Left eye: No discharge.     Conjunctiva/sclera: Conjunctivae normal.  Pulmonary:     Effort: Pulmonary effort is normal.     Breath sounds: No stridor.  Neurological:  Mental Status: He is alert and oriented to person, place, and time. Mental status is at baseline.     ED Results / Procedures / Treatments   Labs (all labs ordered are listed, but only abnormal results are displayed) Labs Reviewed - No data to display  EKG None  Radiology No results found.  Procedures Procedures (including critical care time)  Medications Ordered in ED Medications - No data to display  ED Course  I have reviewed the triage vital signs and the nursing notes.  Pertinent labs & imaging results that were available during my care of the patient were reviewed by me and considered in my medical decision making (see chart for details).    MDM Rules/Calculators/A&P                          Patient is 24 year old male presented with dental pain.  He has periapical abscess present on  physical exam.  No trismus forage motion of tongue.  No sublingual tenderness to palpation.  Doubt Ludwick's, PTA, RPA, deep space infection, significant abscess.  Suspect periapical abscess.  We will treat with 10 days of Augmentin and have him follow-up with [facility] on-call dental consultant.  Patient understands return precautions.  Understands importance of follow-up.  Final Clinical Impression(s) / ED Diagnoses Final diagnoses:  Dental caries  Periapical abscess    Rx / DC Orders ED Discharge Orders         Ordered    amoxicillin-clavulanate (AUGMENTIN) 875-125 MG tablet  Every 12 hours        11/02/19 1113           Solon Augusta [place], [place] 11/02/19 1114    Arby Barrette, MD 11/03/19 1347

## 2019-11-02 NOTE — ED Notes (Signed)
Patient verbalizes understanding of discharge instructions. Opportunity for questioning and answers were provided. Armband removed by staff, pt discharged from ED.  

## 2019-11-02 NOTE — Discharge Instructions (Signed)
Please take antibiotics as prescribed for entire course (10 days).  Please follow-up with your dentist I have recommended you.

## 2020-05-14 ENCOUNTER — Emergency Department
Admission: EM | Admit: 2020-05-14 | Discharge: 2020-05-14 | Disposition: A | Discharge status: Home / Self Care | Payer: 59 | Attending: Emergency Medicine | Admitting: Emergency Medicine

## 2020-05-14 ENCOUNTER — Emergency Department: Payer: 59

## 2020-05-14 ENCOUNTER — Other Ambulatory Visit: Payer: Self-pay

## 2020-05-14 DIAGNOSIS — F17210 Nicotine dependence, cigarettes, uncomplicated: Secondary | ICD-10-CM | POA: Insufficient documentation

## 2020-05-14 DIAGNOSIS — M542 Cervicalgia: Secondary | ICD-10-CM | POA: Insufficient documentation

## 2020-05-14 DIAGNOSIS — R42 Dizziness and giddiness: Secondary | ICD-10-CM | POA: Diagnosis present

## 2020-05-14 DIAGNOSIS — R519 Headache, unspecified: Secondary | ICD-10-CM | POA: Diagnosis not present

## 2020-05-14 DIAGNOSIS — J45909 Unspecified asthma, uncomplicated: Secondary | ICD-10-CM | POA: Insufficient documentation

## 2020-05-14 LAB — BASIC METABOLIC PANEL
Anion gap: 9 (ref 5–15)
BUN: 15 mg/dL (ref 6–20)
CO2: 23 mmol/L (ref 22–32)
Calcium: 9.5 mg/dL (ref 8.9–10.3)
Chloride: 108 mmol/L (ref 98–111)
Creatinine, Ser: 0.74 mg/dL (ref 0.61–1.24)
GFR, Estimated: >60 mL/min (ref >60)
Glucose, Bld: 99 mg/dL (ref 70–99)
Potassium: 4.2 mmol/L (ref 3.5–5.1)
Sodium: 140 mmol/L (ref 135–145)

## 2020-05-14 LAB — CBC
HCT: 40.8 % (ref 39.0–52.0)
Hemoglobin: 14 g/dL (ref 13.0–17.0)
MCH: 30.3 pg (ref 26.0–34.0)
MCHC: 34.3 g/dL (ref 30.0–36.0)
MCV: 88.3 fL (ref 80.0–100.0)
Platelets: 283 10*3/uL (ref 150–400)
RBC: 4.62 MIL/uL (ref 4.22–5.81)
RDW: 13.2 % (ref 11.5–15.5)
WBC: 14.4 10*3/uL — ABNORMAL HIGH (ref 4.0–10.5)
nRBC: 0 % (ref 0.0–0.2)

## 2020-05-14 MED ORDER — IOHEXOL 350 MG/ML SOLN
75.0000 mL | Freq: Once | INTRAVENOUS | Status: AC | PRN
  Administered 2020-05-14: 75 mL via INTRAVENOUS

## 2020-05-14 MED ORDER — ACETAMINOPHEN 500 MG PO TABS
1000.0000 mg | ORAL_TABLET | Freq: Once | ORAL | Status: DC
  Filled 2020-05-14: qty 2

## 2020-05-14 NOTE — ED Triage Notes (Signed)
Pt to ER after syncopal event this morning around 8am, reports he got up to stretch, got dizzy, and passed out. States he thinks he was unconscious for approx 10-15 minutes.   States for approx 1 year he has been having intermittent episodes of dizziness where he feels like he is going to pass out, but this is the first time he has. Denies ongoing symptoms at this time. Denies dizziness or headache.

## 2020-05-14 NOTE — ED Provider Notes (Signed)
[facility] Emergency Department Provider Note  ____________________________________________   Event Date/Time   First MD Initiated Contact with Patient 05/14/20 1335     (approximate)  I have reviewed the triage vital signs and the nursing notes.   HISTORY  Chief Complaint Loss of Consciousness    HPI Eric Arellano is a 25 y.o. male with asthma who comes in for syncope.  Patient reports around 8 AM.  Patient states that he occasionally takes Xanax at nighttime to help him sleep.  He states that he does not take it every night.  He gets it from somebody who has a prescription.  He states that he has these dizzy episodes multiple mornings but has not had it for 3 months.  He states that this morning he woke up and talk to his girlfriend on the phone and then was stretching and started to feel really dizzy.  He states that he then has a period of time that he does not remember perhaps 10 to 15 minutes and he woke up and he was sitting in the shower.  He does not think he fell and hit his head but he does not really remember anything.  He states that he has been having some intermittent headaches for about a week denies a history of this.  He states that his mom did have a history of an aneurysm but he denies any prior history from himself.  He currently has a lower headache and some neck pain but denies any dizziness at this time    Past Medical History:  Diagnosis Date  . Asthma     There are no problems to display for this patient.   History reviewed. No pertinent surgical history.  Prior to Admission medications   Medication Sig Start Date End Date Taking? Authorizing Provider  chlorhexidine (PERIDEX) 0.12 % solution Use as directed 15 mLs in the mouth or throat 2 (two) times daily. 03/23/19   Cathie Hoops, Amy V, PA-C  diclofenac (VOLTAREN) 50 MG EC tablet Take 1 tablet (50 mg total) by mouth 2 (two) times daily. 03/23/19   Belinda Fisher, PA-C    Allergies Patient  has no known allergies.  No family history on file.  Social History Social History   Tobacco Use  . Smoking status: Current Every Day Smoker    Packs/day: 0.50    Types: Cigarettes  . Smokeless tobacco: Never Used  Substance Use Topics  . Alcohol use: No  . Drug use: No      Review of Systems Constitutional: No fever/chills Eyes: No visual changes. ENT: No sore throat. Cardiovascular: Denies chest pain. Respiratory: Denies shortness of breath. Gastrointestinal: No abdominal pain.  No nausea, no vomiting.  No diarrhea.  No constipation. Genitourinary: Negative for dysuria. Musculoskeletal: Negative for back pain.  Positive neck pain Skin: Negative for rash. Neurological: Positive headache, no focal weakness or numbness. All other ROS negative ____________________________________________   PHYSICAL EXAM:  VITAL SIGNS: ED Triage Vitals  Enc Vitals Group     BP 05/14/20 1226 126/74     Pulse Rate 05/14/20 1226 66     Resp 05/14/20 1226 16     Temp 05/14/20 1226 97.9 F (36.6 C)     Temp Source 05/14/20 1226 Oral     SpO2 05/14/20 1226 98 %     Weight 05/14/20 1227 153 lb (69.4 kg)     Height 05/14/20 1227 5\' 5"  (1.651 m)     Head Circumference --  Peak Flow --      Pain Score 05/14/20 1227 2     Pain Loc --      Pain Edu? --      Excl. in GC? --     Constitutional: Alert and oriented. Well appearing and in no acute distress. Eyes: Conjunctivae are normal. EOMI. Head: Atraumatic. Nose: No congestion/rhinnorhea. Mouth/Throat: Mucous membranes are moist.   Neck: No stridor. Trachea Midline. FROM.  C-spine tenderness Cardiovascular: Normal rate, regular rhythm. Grossly normal heart sounds.  Good peripheral circulation. Respiratory: Normal respiratory effort.  No retractions. Lungs CTAB. Gastrointestinal: Soft and nontender. No distention. No abdominal bruits.  Musculoskeletal: No lower extremity tenderness nor edema.  No joint effusions. Neurologic:   Normal speech and language. No gross focal neurologic deficits are appreciated.  Skin:  Skin is warm, dry and intact. No rash noted. Psychiatric: Mood and affect are normal. Speech and behavior are normal. GU: Deferred   ____________________________________________   LABS (all labs ordered are listed, but only abnormal results are displayed)  Labs Reviewed  CBC - Abnormal; Notable for the following components:      Result Value   WBC 14.4 (*)    All other components within normal limits  BASIC METABOLIC PANEL  URINALYSIS, COMPLETE (UACMP) WITH MICROSCOPIC   ____________________________________________   ED ECG REPORT I, Concha Se, the attending physician, personally viewed and interpreted this ECG.  Normal sinus rate of 73, no ST elevation, T wave version in lead III, normal intervals ____________________________________________  RADIOLOGY Vela Prose, personally viewed and evaluated these images (plain radiographs) as part of my medical decision making, as well as reviewing the written report by the radiologist.  ED MD interpretation:  Pending   Official radiology report(s): No results found.  ____________________________________________   PROCEDURES  Procedure(s) performed (including Critical Care):  .1-3 Lead EKG Interpretation Performed by: Concha Se, MD Authorized by: Concha Se, MD     Interpretation: normal     ECG rate:  60s   ECG rate assessment: normal     Rhythm: sinus rhythm     Ectopy: none     Conduction: normal   Comments:     Occasionally goes sinus bradycardia as well     ____________________________________________   INITIAL IMPRESSION / ASSESSMENT AND PLAN / ED COURSE  Eric Arellano was evaluated in Emergency Department on 05/14/2020 for the symptoms described in the history of present illness. He was evaluated in the context of the global COVID-19 pandemic, which necessitated consideration that the patient might be at  risk for infection with the SARS-CoV-2 virus that causes COVID-19. Institutional protocols and algorithms that pertain to the evaluation of patients at risk for COVID-19 are in a state of rapid change based on information released by regulatory bodies including the CDC and federal and state organizations. These policies and algorithms were followed during the patient's care in the ED.    Patient is a well-appearing 25 year old with normal vital signs who comes in with dizziness and episode where he does not have memory for about 15 minutes he is not sure if he syncopized.  No witnessed seizure activity no tongue biting.  Unclear if this could be seizure versus syncope versus secondary to substances.  However patient does report a back of his headache and neck pain.  Mom does have a history of aneurysm.  Have low suspicion for dissection or aneurysm but given the above symptoms without a great cause will get CT  just to ensure no aneurysm, cervical fracture or cranial hemorrhage.  EKG was without arrhythmia patient was kept on the cardiac monitor.  Patient be handed off to oncoming team pending CT imaging and appropriate disposition       ____________________________________________   FINAL CLINICAL IMPRESSION(S) / ED DIAGNOSES   Final diagnoses:  Dizzy      MEDICATIONS GIVEN DURING THIS VISIT:  Medications  acetaminophen (TYLENOL) tablet 1,000 mg (has no administration in time range)     ED Discharge Orders    None       Note:  This document was prepared using Dragon voice recognition software and may include unintentional dictation errors.   Concha Se, MD 05/14/20 [phone]

## 2020-05-14 NOTE — ED Notes (Signed)
This RN heard loud banging noise, walking down halls looking for noise, passed room 3 where pt stated "that was me", this RN asked what pt meant as pt was walking around room, pt stated he was banging on trash can to speed process along because he had been here for over 8 hours. This RN informed pt not to do that and left room.   Pt visitor out to hallway staring at RN and yelling that RN was disrespectful and talking to Charge RN about pt and wanted to know why it had been over an hour for results. This RN informed visitor that it is emergency dpt, MD is caring for other pts and will be in when able, visitor started yelling at Charge RN Herbert Seta not to speak to her and calling her a "little girl". Pt and visitor escorted out by security. Pt left AMA, MD [facility] aware.   As pt leaving, RN informed pt that IV needed to come out before leaving, pt stated IV was out and another RN could see IV still in place. This RN once again informed pt as walking down hallway that IV needed to see IV and it needed to come out. Pt pulled IV out and threw on floor.

## 2020-05-14 NOTE — ED Notes (Signed)
Pt stated that he is "tired of being here, has waited for 6 hours and wants to leave"; pt refused IV and CT

## 2021-10-08 IMAGING — CT CT ANGIO HEAD
1 of 10 series · 6 of 34 positions shown · IV contrast (APPLIED)
Comparison: Head CT 09/10/2015

CLINICAL DATA: Syncopal episode and dizziness.

EXAM:
CT ANGIOGRAPHY HEAD AND NECK
TECHNIQUE: Multidetector CT imaging of the head and neck was performed using
the standard protocol during bolus administration of intravenous
contrast. Multiplanar CT image reconstructions and MIPs were
obtained to evaluate the vascular anatomy. Carotid stenosis
measurements (when applicable) are obtained utilizing NASCET
criteria, using the distal internal carotid diameter as the
denominator.
CONTRAST:  75mL OMNIPAQUE IOHEXOL 350 MG/ML SOLN

[Series 6: ax thin · axial · 0.48mm/px · z∈[-340,-63]mm · 6 of 391 slices shown]
[im 56/391  soft-tissue]
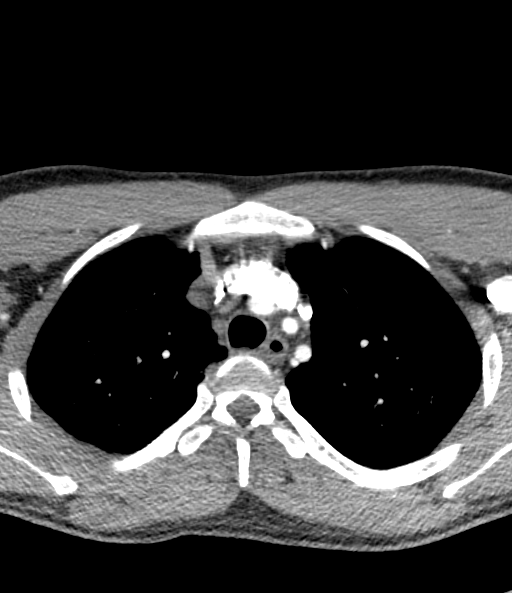
[im 112/391  bone]
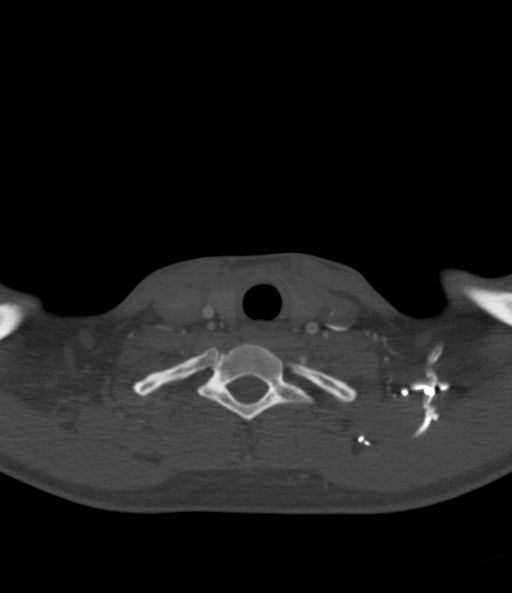
[im 168/391  soft-tissue]
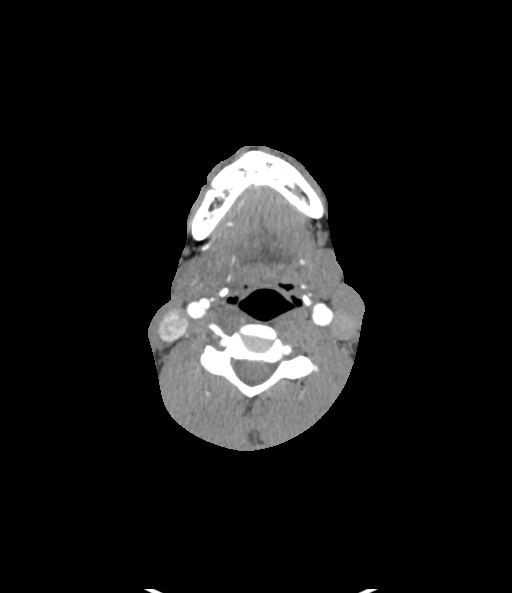
[im 223/391  bone]
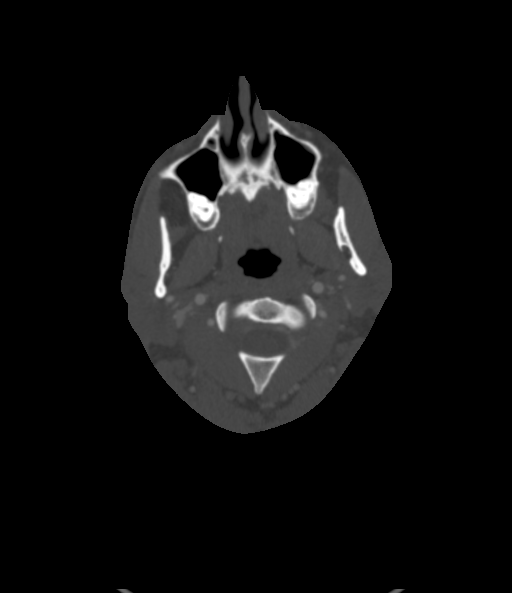
[im 279/391  soft-tissue]
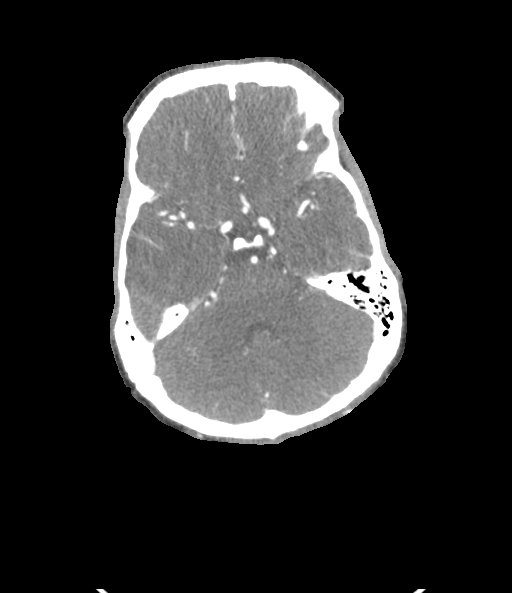
[im 335/391  bone]
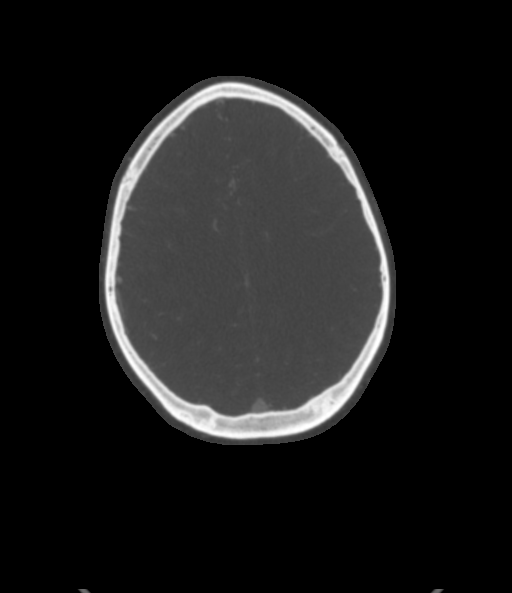

[6 of 34 positions shown; findings below may reference images not displayed]

FINDINGS: CT HEAD FINDINGS

Brain: The brain shows a normal appearance without evidence of
malformation, atrophy, old or acute small or large vessel
infarction, mass lesion, hemorrhage, hydrocephalus or extra-axial
collection.

Vascular: No hyperdense vessel. No evidence of atherosclerotic
calcification.

Skull: Normal.  No traumatic finding.  No focal bone lesion.

Sinuses/Orbits: Sinuses are clear. Orbits appear normal. Mastoids
are clear.

Other: None significant

CTA NECK FINDINGS

Aortic arch: Aortic arch is normal.  Branching pattern is normal.

Right carotid system: Normal. No atherosclerotic disease. No
dissection.

Left carotid system: Normal. No atherosclerotic disease. No
dissection.

Vertebral arteries: Vertebral artery origins are normal. Both
vertebral arteries are normal through the cervical region to the
foramen magnum. No dissection.

Skeleton: Normal

Other neck: Normal

Upper chest: Normal

Review of the MIP images confirms the above findings

CTA HEAD FINDINGS

Anterior circulation: Both internal carotid arteries are widely
patent through the skull base and siphon regions. The anterior and
middle cerebral vessels are normal without stenosis, aneurysm or
vascular malformation. No branch vessel occlusion identified.

Posterior circulation: Both vertebral arteries widely patent to the
basilar. No basilar stenosis. Posterior circulation branch vessels
are normal.

Venous sinuses: Patent and normal.

Anatomic variants: None

Review of the MIP images confirms the above findings
IMPRESSION: Normal examination. No evidence of atherosclerotic disease. No
dissection. No large or medium vessel occlusion.

## 2023-02-18 ENCOUNTER — Inpatient Hospital Stay (HOSPITAL_COMMUNITY)
Admission: EM | Admit: 2023-02-18 | Discharge: 2023-02-20 | DRG: 101 | Disposition: A | Discharge status: Home / Self Care | Payer: Self-pay | Attending: Pulmonary Disease | Admitting: Pulmonary Disease

## 2023-02-18 ENCOUNTER — Emergency Department (HOSPITAL_COMMUNITY): Payer: Self-pay

## 2023-02-18 ENCOUNTER — Inpatient Hospital Stay (HOSPITAL_COMMUNITY): Payer: Self-pay

## 2023-02-18 ENCOUNTER — Other Ambulatory Visit: Payer: Self-pay

## 2023-02-18 ENCOUNTER — Encounter (HOSPITAL_COMMUNITY): Payer: Self-pay

## 2023-02-18 DIAGNOSIS — R569 Unspecified convulsions: Principal | ICD-10-CM

## 2023-02-18 DIAGNOSIS — G40401 Other generalized epilepsy and epileptic syndromes, not intractable, with status epilepticus: Principal | ICD-10-CM | POA: Diagnosis present

## 2023-02-18 DIAGNOSIS — Z79899 Other long term (current) drug therapy: Secondary | ICD-10-CM

## 2023-02-18 DIAGNOSIS — J45909 Unspecified asthma, uncomplicated: Secondary | ICD-10-CM | POA: Diagnosis present

## 2023-02-18 DIAGNOSIS — Z87820 Personal history of traumatic brain injury: Secondary | ICD-10-CM

## 2023-02-18 DIAGNOSIS — F17290 Nicotine dependence, other tobacco product, uncomplicated: Secondary | ICD-10-CM | POA: Diagnosis present

## 2023-02-18 DIAGNOSIS — R4182 Altered mental status, unspecified: Secondary | ICD-10-CM

## 2023-02-18 DIAGNOSIS — G4700 Insomnia, unspecified: Secondary | ICD-10-CM | POA: Diagnosis not present

## 2023-02-18 DIAGNOSIS — E872 Acidosis, unspecified: Secondary | ICD-10-CM | POA: Diagnosis present

## 2023-02-18 DIAGNOSIS — F17210 Nicotine dependence, cigarettes, uncomplicated: Secondary | ICD-10-CM | POA: Diagnosis present

## 2023-02-18 DIAGNOSIS — G40901 Epilepsy, unspecified, not intractable, with status epilepticus: Principal | ICD-10-CM | POA: Diagnosis present

## 2023-02-18 DIAGNOSIS — Z5971 Insufficient health insurance coverage: Secondary | ICD-10-CM

## 2023-02-18 LAB — CBC WITH DIFFERENTIAL/PLATELET
Abs Immature Granulocytes: 0 10*3/uL (ref 0.00–0.07)
Basophils Absolute: 0 10*3/uL (ref 0.0–0.1)
Basophils Relative: 0 %
Eosinophils Absolute: 0 10*3/uL (ref 0.0–0.5)
Eosinophils Relative: 0 %
HCT: 46.2 % (ref 39.0–52.0)
Hemoglobin: 15.4 g/dL (ref 13.0–17.0)
Lymphocytes Relative: 8 %
Lymphs Abs: 2.4 10*3/uL (ref 0.7–4.0)
MCH: 30.5 pg (ref 26.0–34.0)
MCHC: 33.3 g/dL (ref 30.0–36.0)
MCV: 91.5 fL (ref 80.0–100.0)
Monocytes Absolute: 1.8 10*3/uL — ABNORMAL HIGH (ref 0.1–1.0)
Monocytes Relative: 6 %
Neutro Abs: 25.9 10*3/uL — ABNORMAL HIGH (ref 1.7–7.7)
Neutrophils Relative %: 86 %
Platelets: 388 10*3/uL (ref 150–400)
RBC: 5.05 MIL/uL (ref 4.22–5.81)
RDW: 12.8 % (ref 11.5–15.5)
WBC: 30.1 10*3/uL — ABNORMAL HIGH (ref 4.0–10.5)
nRBC: 0 % (ref 0.0–0.2)
nRBC: 0 /100{WBCs}

## 2023-02-18 LAB — I-STAT CHEM 8, ED
BUN: 18 mg/dL (ref 6–20)
Calcium, Ion: 1.06 mmol/L — ABNORMAL LOW (ref 1.15–1.40)
Chloride: 110 mmol/L (ref 98–111)
Creatinine, Ser: 0.9 mg/dL (ref 0.61–1.24)
Glucose, Bld: 212 mg/dL — ABNORMAL HIGH (ref 70–99)
HCT: 46 % (ref 39.0–52.0)
Hemoglobin: 15.6 g/dL (ref 13.0–17.0)
Potassium: 4.2 mmol/L (ref 3.5–5.1)
Sodium: 137 mmol/L (ref 135–145)
TCO2: 18 mmol/L — ABNORMAL LOW (ref 22–32)

## 2023-02-18 LAB — RESP PANEL BY RT-PCR (RSV, FLU A&B, COVID)  RVPGX2
Influenza A by PCR: NEGATIVE
Influenza B by PCR: NEGATIVE
Resp Syncytial Virus by PCR: NEGATIVE
SARS Coronavirus 2 by RT PCR: NEGATIVE

## 2023-02-18 LAB — URINALYSIS, ROUTINE W REFLEX MICROSCOPIC
Bilirubin Urine: NEGATIVE
Glucose, UA: >=500 mg/dL — AB
Ketones, ur: NEGATIVE mg/dL
Leukocytes,Ua: NEGATIVE
Nitrite: NEGATIVE
Protein, ur: 100 mg/dL — AB
Specific Gravity, Urine: 1.039 — ABNORMAL HIGH (ref 1.005–1.030)
pH: 5 (ref 5.0–8.0)

## 2023-02-18 LAB — I-STAT VENOUS BLOOD GAS, ED
Acid-base deficit: 9 mmol/L — ABNORMAL HIGH (ref 0.0–2.0)
Bicarbonate: 17.5 mmol/L — ABNORMAL LOW (ref 20.0–28.0)
Calcium, Ion: 1.08 mmol/L — ABNORMAL LOW (ref 1.15–1.40)
HCT: 48 % (ref 39.0–52.0)
Hemoglobin: 16.3 g/dL (ref 13.0–17.0)
O2 Saturation: 97 %
Potassium: 4.3 mmol/L (ref 3.5–5.1)
Sodium: 138 mmol/L (ref 135–145)
TCO2: 19 mmol/L — ABNORMAL LOW (ref 22–32)
pCO2, Ven: 38.5 mm[Hg] — ABNORMAL LOW (ref 44–60)
pH, Ven: 7.264 (ref 7.25–7.43)
pO2, Ven: 104 mm[Hg] — ABNORMAL HIGH (ref 32–45)

## 2023-02-18 LAB — BASIC METABOLIC PANEL
Anion gap: 13 (ref 5–15)
BUN: 15 mg/dL (ref 6–20)
CO2: 16 mmol/L — ABNORMAL LOW (ref 22–32)
Calcium: 8.8 mg/dL — ABNORMAL LOW (ref 8.9–10.3)
Chloride: 106 mmol/L (ref 98–111)
Creatinine, Ser: 0.95 mg/dL (ref 0.61–1.24)
GFR, Estimated: >60 mL/min (ref >60)
Glucose, Bld: 212 mg/dL — ABNORMAL HIGH (ref 70–99)
Potassium: 4.3 mmol/L (ref 3.5–5.1)
Sodium: 135 mmol/L (ref 135–145)

## 2023-02-18 LAB — CSF CELL COUNT WITH DIFFERENTIAL
RBC Count, CSF: 2 /mm3 — ABNORMAL HIGH
Tube #: 1
WBC, CSF: 3 /mm3 (ref 0–5)

## 2023-02-18 LAB — MENINGITIS/ENCEPHALITIS PANEL (CSF)

## 2023-02-18 LAB — HEPATIC FUNCTION PANEL
ALT: 17 U/L (ref 0–44)
AST: 26 U/L (ref 15–41)
Albumin: 4.3 g/dL (ref 3.5–5.0)
Alkaline Phosphatase: 111 U/L (ref 38–126)
Bilirubin, Direct: 0.4 mg/dL — ABNORMAL HIGH (ref 0.0–0.2)
Indirect Bilirubin: 0.5 mg/dL (ref 0.3–0.9)
Total Bilirubin: 0.9 mg/dL (ref 0.0–1.2)
Total Protein: 7.5 g/dL (ref 6.5–8.1)

## 2023-02-18 LAB — I-STAT ARTERIAL BLOOD GAS, ED
Acid-base deficit: 8 mmol/L — ABNORMAL HIGH (ref 0.0–2.0)
Bicarbonate: 20.4 mmol/L (ref 20.0–28.0)
Calcium, Ion: 1.3 mmol/L (ref 1.15–1.40)
HCT: 43 % (ref 39.0–52.0)
Hemoglobin: 14.6 g/dL (ref 13.0–17.0)
O2 Saturation: 100 %
Patient temperature: 36.3
Potassium: 3.8 mmol/L (ref 3.5–5.1)
Sodium: 139 mmol/L (ref 135–145)
TCO2: 22 mmol/L (ref 22–32)
pCO2 arterial: 51.3 mm[Hg] — ABNORMAL HIGH (ref 32–48)
pH, Arterial: 7.204 — ABNORMAL LOW (ref 7.35–7.45)
pO2, Arterial: 326 mm[Hg] — ABNORMAL HIGH (ref 83–108)

## 2023-02-18 LAB — MAGNESIUM: Magnesium: 2 mg/dL (ref 1.7–2.4)

## 2023-02-18 LAB — CBG MONITORING, ED: Glucose-Capillary: 103 mg/dL — ABNORMAL HIGH (ref 70–99)

## 2023-02-18 LAB — RAPID URINE DRUG SCREEN, HOSP PERFORMED
Amphetamines: NOT DETECTED
Barbiturates: NOT DETECTED
Benzodiazepines: POSITIVE — AB
Cocaine: NOT DETECTED
Opiates: NOT DETECTED
Tetrahydrocannabinol: POSITIVE — AB

## 2023-02-18 LAB — ETHANOL: Alcohol, Ethyl (B): <10 mg/dL (ref <10)

## 2023-02-18 LAB — MRSA NEXT GEN BY PCR, NASAL: MRSA by PCR Next Gen: NOT DETECTED

## 2023-02-18 LAB — PROTEIN AND GLUCOSE, CSF
Glucose, CSF: 100 mg/dL — ABNORMAL HIGH (ref 40–70)
Total  Protein, CSF: 65 mg/dL — ABNORMAL HIGH (ref 15–45)

## 2023-02-18 LAB — HEMOGLOBIN A1C
Hgb A1c MFr Bld: 5.8 % — ABNORMAL HIGH (ref 4.8–5.6)
Mean Plasma Glucose: 119.76 mg/dL

## 2023-02-18 LAB — GLUCOSE, CAPILLARY
Glucose-Capillary: 77 mg/dL (ref 70–99)
Glucose-Capillary: 81 mg/dL (ref 70–99)

## 2023-02-18 LAB — HIV ANTIBODY (ROUTINE TESTING W REFLEX): HIV Screen 4th Generation wRfx: NONREACTIVE

## 2023-02-18 MED ORDER — FENTANYL 2500MCG IN NS 250ML (10MCG/ML) PREMIX INFUSION
50.0000 ug/h | INTRAVENOUS | Status: DC
  Administered 2023-02-18: 50 ug/h via INTRAVENOUS
  Administered 2023-02-19: 125 ug/h via INTRAVENOUS
  Filled 2023-02-18 (×2): qty 250

## 2023-02-18 MED ORDER — DEXAMETHASONE SODIUM PHOSPHATE 4 MG/ML IJ SOLN
4.0000 mg | Freq: Four times a day (QID) | INTRAMUSCULAR | Status: DC
Start: 2023-02-18 — End: 2023-02-19
  Administered 2023-02-18 – 2023-02-19 (×3): 4 mg via INTRAVENOUS
  Filled 2023-02-18 (×3): qty 1

## 2023-02-18 MED ORDER — SODIUM CHLORIDE 0.9 % IV SOLN: 250.0000 mL | INTRAVENOUS | Status: AC

## 2023-02-18 MED ORDER — ETOMIDATE 2 MG/ML IV SOLN
INTRAVENOUS | Status: DC | PRN
  Administered 2023-02-18: 30 mg via INTRAVENOUS

## 2023-02-18 MED ORDER — DOCUSATE SODIUM 50 MG/5ML PO LIQD
100.0000 mg | Freq: Two times a day (BID) | ORAL | Status: DC
  Administered 2023-02-18: 100 mg
  Filled 2023-02-18 (×2): qty 10

## 2023-02-18 MED ORDER — SODIUM CHLORIDE 0.9 % IV SOLN: INTRAVENOUS | Status: AC

## 2023-02-18 MED ORDER — SODIUM CHLORIDE 0.9 % IV SOLN
2.0000 g | Freq: Once | INTRAVENOUS | Status: AC
  Administered 2023-02-18: 2 g via INTRAVENOUS
  Filled 2023-02-18: qty 20

## 2023-02-18 MED ORDER — FENTANYL CITRATE PF 50 MCG/ML IJ SOSY
50.0000 ug | PREFILLED_SYRINGE | Freq: Once | INTRAMUSCULAR | Status: AC
  Administered 2023-02-18: 50 ug via INTRAVENOUS

## 2023-02-18 MED ORDER — CHLORHEXIDINE GLUCONATE CLOTH 2 % EX PADS
6.0000 | MEDICATED_PAD | Freq: Every day | CUTANEOUS | Status: DC
  Administered 2023-02-18 – 2023-02-20 (×3): 6 via TOPICAL

## 2023-02-18 MED ORDER — ENOXAPARIN SODIUM 40 MG/0.4ML IJ SOSY
40.0000 mg | PREFILLED_SYRINGE | INTRAMUSCULAR | Status: DC
  Administered 2023-02-19: 40 mg via SUBCUTANEOUS
  Filled 2023-02-18: qty 0.4

## 2023-02-18 MED ORDER — LORAZEPAM 2 MG/ML IJ SOLN
INTRAMUSCULAR | Status: AC
  Filled 2023-02-18: qty 1

## 2023-02-18 MED ORDER — FENTANYL BOLUS VIA INFUSION
50.0000 ug | INTRAVENOUS | Status: DC | PRN
  Administered 2023-02-18: 50 ug via INTRAVENOUS
  Administered 2023-02-19 (×2): 100 ug via INTRAVENOUS

## 2023-02-18 MED ORDER — ACYCLOVIR SODIUM 50 MG/ML IV SOLN
700.0000 mg | Freq: Three times a day (TID) | INTRAVENOUS | Status: DC
  Administered 2023-02-18 – 2023-02-19 (×2): 700 mg via INTRAVENOUS
  Filled 2023-02-18 (×10): qty 14

## 2023-02-18 MED ORDER — VANCOMYCIN HCL 1500 MG/300ML IV SOLN
1500.0000 mg | Freq: Once | INTRAVENOUS | Status: AC
  Administered 2023-02-18: 1500 mg via INTRAVENOUS
  Filled 2023-02-18: qty 300

## 2023-02-18 MED ORDER — IOHEXOL 350 MG/ML SOLN
75.0000 mL | Freq: Once | INTRAVENOUS | Status: AC | PRN
  Administered 2023-02-18: 75 mL via INTRAVENOUS

## 2023-02-18 MED ORDER — PROPOFOL 1000 MG/100ML IV EMUL
5.0000 ug/kg/min | INTRAVENOUS | Status: DC
  Administered 2023-02-18: 50 ug/kg/min via INTRAVENOUS
  Administered 2023-02-18: 35 ug/kg/min via INTRAVENOUS
  Administered 2023-02-19: 50 ug/kg/min via INTRAVENOUS
  Filled 2023-02-18 (×4): qty 100

## 2023-02-18 MED ORDER — ROCURONIUM BROMIDE 10 MG/ML (PF) SYRINGE
PREFILLED_SYRINGE | INTRAVENOUS | Status: DC | PRN
  Administered 2023-02-18: 70 mg via INTRAVENOUS

## 2023-02-18 MED ORDER — INSULIN ASPART 100 UNIT/ML IJ SOLN
0.0000 [IU] | INTRAMUSCULAR | Status: DC
  Administered 2023-02-19 (×2): 1 [IU] via SUBCUTANEOUS

## 2023-02-18 MED ORDER — ORAL CARE MOUTH RINSE
15.0000 mL | OROMUCOSAL | Status: DC
  Administered 2023-02-18 – 2023-02-19 (×9): 15 mL via OROMUCOSAL

## 2023-02-18 MED ORDER — SODIUM CHLORIDE 0.9 % IV SOLN
2.0000 g | Freq: Two times a day (BID) | INTRAVENOUS | Status: DC
  Administered 2023-02-19: 2 g via INTRAVENOUS
  Filled 2023-02-18: qty 20

## 2023-02-18 MED ORDER — POLYETHYLENE GLYCOL 3350 17 G PO PACK: 17.0000 g | PACK | Freq: Every day | ORAL | Status: DC | PRN

## 2023-02-18 MED ORDER — NOREPINEPHRINE 4 MG/250ML-% IV SOLN
2.0000 ug/min | INTRAVENOUS | Status: DC
  Administered 2023-02-18: 2 ug/min via INTRAVENOUS
  Filled 2023-02-18: qty 250

## 2023-02-18 MED ORDER — INSULIN ASPART 100 UNIT/ML IJ SOLN: 0.0000 [IU] | Freq: Three times a day (TID) | INTRAMUSCULAR | Status: DC

## 2023-02-18 MED ORDER — MIDAZOLAM HCL (PF) 10 MG/2ML IJ SOLN
INTRAMUSCULAR | Status: AC
  Administered 2023-02-18: 10 mg
  Filled 2023-02-18: qty 2

## 2023-02-18 MED ORDER — ORAL CARE MOUTH RINSE
15.0000 mL | OROMUCOSAL | Status: DC | PRN
Start: 2023-02-18 — End: 2023-02-19

## 2023-02-18 MED ORDER — LEVETIRACETAM IN NACL 1500 MG/100ML IV SOLN
1500.0000 mg | Freq: Two times a day (BID) | INTRAVENOUS | Status: DC
  Administered 2023-02-19: 1500 mg via INTRAVENOUS
  Filled 2023-02-18: qty 100

## 2023-02-18 MED ORDER — PROPOFOL 1000 MG/100ML IV EMUL: 5.0000 ug/kg/min | INTRAVENOUS | Status: DC

## 2023-02-18 MED ORDER — MIDAZOLAM HCL 2 MG/2ML IJ SOLN
2.0000 mg | INTRAMUSCULAR | Status: DC | PRN
  Administered 2023-02-19: 2 mg via INTRAVENOUS
  Filled 2023-02-18: qty 2

## 2023-02-18 MED ORDER — VANCOMYCIN HCL IN DEXTROSE 1-5 GM/200ML-% IV SOLN
1000.0000 mg | Freq: Three times a day (TID) | INTRAVENOUS | Status: DC
  Administered 2023-02-18 – 2023-02-19 (×2): 1000 mg via INTRAVENOUS
  Filled 2023-02-18 (×2): qty 200

## 2023-02-18 MED ORDER — PANTOPRAZOLE SODIUM 40 MG IV SOLR
40.0000 mg | Freq: Every day | INTRAVENOUS | Status: DC
  Administered 2023-02-18: 40 mg via INTRAVENOUS
  Filled 2023-02-18: qty 10

## 2023-02-18 MED ORDER — FENTANYL CITRATE PF 50 MCG/ML IJ SOSY
PREFILLED_SYRINGE | INTRAMUSCULAR | Status: AC
  Filled 2023-02-18: qty 1

## 2023-02-18 MED ORDER — FENTANYL CITRATE PF 50 MCG/ML IJ SOSY
100.0000 ug | PREFILLED_SYRINGE | Freq: Once | INTRAMUSCULAR | Status: AC
  Administered 2023-02-18: 100 ug via INTRAVENOUS
  Filled 2023-02-18: qty 2

## 2023-02-18 MED ORDER — MIDAZOLAM HCL 2 MG/2ML IJ SOLN
5.0000 mg | Freq: Once | INTRAMUSCULAR | Status: DC
Start: 2023-02-18 — End: 2023-02-20

## 2023-02-18 MED ORDER — DOCUSATE SODIUM 100 MG PO CAPS
100.0000 mg | ORAL_CAPSULE | Freq: Two times a day (BID) | ORAL | Status: DC | PRN
  Administered 2023-02-19: 100 mg via ORAL
  Filled 2023-02-18: qty 1

## 2023-02-18 MED ORDER — PROPOFOL 1000 MG/100ML IV EMUL
INTRAVENOUS | Status: AC
  Filled 2023-02-18: qty 100

## 2023-02-18 MED ORDER — DEXTROSE 5 % IV SOLN
10.0000 mg/kg | Freq: Three times a day (TID) | INTRAVENOUS | Status: DC
  Filled 2023-02-18 (×3): qty 15

## 2023-02-18 MED ORDER — LEVETIRACETAM IN NACL 1000 MG/100ML IV SOLN
4000.0000 mg | Freq: Once | INTRAVENOUS | Status: AC
  Administered 2023-02-18: 1000 mg via INTRAVENOUS
  Filled 2023-02-18: qty 400

## 2023-02-18 MED ORDER — POLYETHYLENE GLYCOL 3350 17 G PO PACK: 17.0000 g | PACK | Freq: Every day | ORAL | Status: DC

## 2023-02-18 NOTE — Progress Notes (Signed)
ED Pharmacy Antibiotic Sign Off An antibiotic consult was received from an ED provider for vancomycin per pharmacy dosing for sepsis. A chart review was completed to assess appropriateness.  A single dose of ceftriaxone placed by the ED provider.   The following one time order(s) were placed per pharmacy consult:  vancomycin 1500 mg x 1 dose  Further antibiotic and/or antibiotic pharmacy consults should be ordered by the admitting provider if indicated.   Thank you for allowing pharmacy to be a part of this patient's care.   Delmar Landau, PharmD, BCPS 02/18/2023 1:37 PM ED Clinical Pharmacist -  [phone]

## 2023-02-18 NOTE — Progress Notes (Signed)
Pharmacy Antibiotic Note  Eric Arellano is a 28 y.o. male for which pharmacy has been consulted for acyclovir, ceftriaxone, and vancomycin dosing for meningitis. Patient presenting with seizure-like activity and has since been intubated.  SCr 0.9 WBC 30.1; LA pending; T 96.6; HR 67; RR 35  Plan: Rocephin 2g q12h Acyclovir 10 mg/kg q8h --NS at 125 ml/hr while on IV acyclovir Vancomycin 1500 mg once then will plan for 1g q8h per nomogram dosing for meningitis indication (Goal trough 15-20) Monitor WBC, fever, renal function, cultures De-escalate when able  Height: 5\' 5"  (165.1 cm) IBW/kg (Calculated) : 61.5  Temp (24hrs), Avg:95.9 F (35.5 C), Min:95.6 F (35.3 C), Max:96.6 F (35.9 C)  Recent Labs  Lab 02/18/23 1346 02/18/23 1354  WBC 30.1*  --   CREATININE 0.95 0.90    CrCl cannot be calculated (Unknown ideal weight.).    No Known Allergies  Microbiology results: Pending  Thank you for allowing pharmacy to be a part of this patient's care.  Delmar Landau, PharmD, BCPS 02/18/2023 3:01 PM ED Clinical Pharmacist -  [phone]

## 2023-02-18 NOTE — Progress Notes (Signed)
Patient transported from ED33 to CT and back to ED33 with RT and RN,  no complications noted.

## 2023-02-18 NOTE — Progress Notes (Signed)
LP before LTM, per Selina Cooley, MD.

## 2023-02-18 NOTE — Progress Notes (Signed)
eLink Physician-Brief Progress Note Patient Name: Eric Arellano DOB: 05/23/1995 MRN: [id number]   Date of Service  02/18/2023  HPI/Events of Note  28 y.o. male with little past medical history besides tobacco/vape use, asthma.  He was well until he called family 02/18/2023 reporting acute onset of a severe headache around 10 AM.  Presents with status epilepticus and suspected meningitis, vent dependent.  Currently not on feeds.  Glucose is well-controlled.  A1c 5.8.  eICU Interventions  Switch from glucose checks AC/at bedtime to every 4 hours while NPO     Intervention Category Intermediate Interventions: Hyperglycemia - evaluation and treatment  Rexine Gowens 02/18/2023, 9:02 PM

## 2023-02-18 NOTE — Progress Notes (Signed)
LTM EEG hooked up and running - no initial skin breakdown - push button tested - [facility] monitoring. MRI Leads

## 2023-02-18 NOTE — H&P (Signed)
NAME:  Eric Arellano, MRN:  [id number], DOB:  July 08, 1995, LOS: 0 ADMISSION DATE:  02/18/2023, CONSULTATION DATE:  02/18/2023 REFERRING MD:  Dr. Renaye Rakers - EDP, CHIEF COMPLAINT:  Status epilepticus    History of Present Illness:  Eric Arellano is a 28 y.o. male with little past medical history besides tobacco/vape use, asthma.  He was well until he called family 02/18/2023 reporting acute onset of a severe headache around 10 AM.  Mother checked him at 11 AM and he had tonic-clonic movements with eyes rolled back in his head.  EMS was called and when they arrived he was confused, postictal, unable to converse with them.  Confused and unable to follow commands and route to the hospital.  In the ED had another witnessed seizure by staff and then prolonged tonic-clonic episode.  No reported trauma.  Per mother's report he has had 2 syncope episodes in the past 1 to 2 months witnessed by his roommates but no history of overt seizure activity.  He has not had any head trauma, new medications, etc.  No history of drug use other than tobacco/vape.  Drug screen is pending  He was emergently intubated in the ED, received midazolam, then started on propofol and fentanyl for sedation, loaded on Keppra.  Head CT/angio head and neck were normal without any evidence of acute traumatic injury to the C-spine.  LP being performed now.  Has been started on ceftriaxone, vancomycin, acyclovir and dexamethasone.  Autoimmune encephalitis panel will also be sent.  Pertinent  Medical History   Past Medical History:  Diagnosis Date   Asthma    Family history: Family members report that there is a family history of AVMs, no known history of aneurysm  Significant Hospital Events: Including procedures, antibiotic start and stop dates in addition to other pertinent events   Head CT 2/2 > no acute abnormalities, no evidence of C-spine injury CT angio Head and neck 2/2 >> negative for any evidence of vascular abnormality or  occlusion.  No evidence of aneurysm  Interim History / Subjective:  Sedated and intubated  Objective   Blood pressure (!) 93/59, pulse 70, temperature (!) 96.1 F (35.6 C), resp. rate 19, height 5\' 5"  (1.651 m), SpO2 100%.    Vent Mode: PRVC FiO2 (%):  [100 %] 100 % Set Rate:  [16 bmp] 16 bmp Vt Set:  [500 mL] 500 mL PEEP:  [5 cmH20] 5 cmH20  No intake or output data in the 24 hours ending 02/18/23 1445 There were no vitals filed for this visit.  Examination: General: Thin man ventilated and sedated HENT: ET tube in good position.  No evidence of tongue laceration Lungs: Clear bilaterally Cardiovascular: Regular, no murmur Abdomen: Soft, nondistended with positive bowel sounds Extremities: No edema Neuro: Sedated and paralyzed   Resolved Hospital Problem list     Assessment & Plan:   Status epilepticus in a patient with no history of seizure activity, new severe headache.  Report of "syncope" over the last couple months could reflect undiagnosed seizures but suspect that this is acute, possibly due to meningitis given reassuring head CTA Suspected meningitis -Appreciate neurology assistance in management -Keppra as ordered.  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  Reported history of asthma, no BD on home med list -Albuterol available if needed    Best Practice (right click and "Reselect all SmartList Selections"  daily)   Diet/type: NPO DVT prophylaxis LMWH Pressure ulcer(s): N/A GI prophylaxis: PPI Lines: N/A Foley:  Yes, and it is still needed Code Status:  full code Last date of multidisciplinary goals of care discussion [pending]  Labs   CBC: Recent Labs  Lab 02/18/23 1346 02/18/23 1354 02/18/23 1437  WBC 30.1*  --   --   NEUTROABS 25.9*  --   --   HGB 15.4 16.3  15.6 14.6  HCT 46.2 48.0  46.0 43.0  MCV 91.5  --   --   PLT 388  --   --     Basic Metabolic Panel: Recent Labs  Lab 02/18/23 1354 02/18/23 1437  NA 138  137 139  K 4.3  4.2 3.8  CL 110  --   GLUCOSE 212*  --   BUN 18  --   CREATININE 0.90  --    GFR: CrCl cannot be calculated (Unknown ideal weight.). Recent Labs  Lab 02/18/23 1346  WBC 30.1*    Liver Function Tests: No results for input(s): "AST", "ALT", "ALKPHOS", "BILITOT", "PROT", "ALBUMIN" in the last 168 hours. No results for input(s): "LIPASE", "AMYLASE" in the last 168 hours. No results for input(s): "AMMONIA" in the last 168 hours.  ABG    Component Value Date/Time   PHART 7.204 (L) 02/18/2023 1437   PCO2ART 51.3 (H) 02/18/2023 1437   PO2ART 326 (H) 02/18/2023 1437   HCO3 20.4 02/18/2023 1437   TCO2 22 02/18/2023 1437   ACIDBASEDEF 8.0 (H) 02/18/2023 1437   O2SAT 100 02/18/2023 1437     Coagulation Profile: No results for input(s): "INR", "PROTIME" in the last 168 hours.  Cardiac Enzymes: No results for input(s): "CKTOTAL", "CKMB", "CKMBINDEX", "TROPONINI" in the last 168 hours.  HbA1C: No results found for: "HGBA1C"  CBG: Recent Labs  Lab 02/18/23 1441  GLUCAP 103*    Review of Systems:   Unable obtain from patient, obtained from chart notes  Past Medical History:  He,  has a past medical history of Asthma.   Surgical History:  History reviewed. No pertinent surgical history.   Social History:   reports that he has been smoking cigarettes. He has never used smokeless tobacco. He reports that he does not drink  alcohol and does not use drugs.   Family History:  His family history is not on file.   Allergies No Known Allergies   Home Medications  Prior to Admission medications   Medication Sig Start Date End Date Taking? Authorizing Provider  chlorhexidine (PERIDEX) 0.12 % solution Use as directed 15 mLs in the mouth or throat 2 (two) times daily. 03/23/19   Cathie Hoops, Amy V, PA-C  diclofenac (VOLTAREN) 50 MG EC tablet Take 1 tablet (50 mg total) by mouth 2 (two) times daily. 03/23/19   Belinda Fisher, PA-C     Critical care time: 45 min     Levy Pupa, MD, PhD 02/18/2023, 3:20 PM [facility] [phone] or if no answer before 7:00PM call [phone] For any issues after 7:00PM please call eLink [phone]     \

## 2023-02-18 NOTE — Consult Note (Signed)
NEUROLOGY CONSULT NOTE   Date of service: February 18, 2023 Patient Name: Eric Arellano MRN:  [id number] DOB:  1995/03/09 Chief Complaint: convulsive status epilepticus Requesting Provider: Leslye Peer, MD  History of Present Illness   This is a 28 year old gentleman with past medical history of asthma, tobacco use, marijuana use without other significant past medical history who presents with generalized convulsive status.  He was last known well until this morning at approximately 10 AM when he called his mother and complained of a severe headache.  Mother checked on him an hour later and he was having tonic-clonic movements with eyes rolled back in his head.  When EMS arrived he was unable to converse with them, lethargic, confused, unable to follow commands.  After arrival to the hospital he had another witnessed generalized tonic-clonic seizure with prolonged postictal stage, had a GCS of 3, required intubation.  He has had 2 syncopal episodes in the past 1 to 2 months that were witnessed by his roommates but no clear motor seizure activity.  He does not use recreational drugs other than marijuana (not synthetic).  No recent head trauma or new medications.  CT head in the ED was negative for ICH or other acute findings.  CT angio head was normal.   ROS   UTA 2/2 intubation  Past History   Past Medical History:  Diagnosis Date   Asthma     History reviewed. No pertinent surgical history.  Family History: History reviewed. No pertinent family history.  Social History  reports that he has been smoking cigarettes. He has never used smokeless tobacco. He reports that he does not drink alcohol and does not use drugs.  No Known Allergies  Medications   Current Facility-Administered Medications:    0.9 %  sodium chloride infusion, , Intravenous, Continuous, Byrum, Les Pou, MD, Last Rate: 125 mL/hr at 02/18/23 1900, Infusion Verify at 02/18/23 1900   0.9 %  sodium chloride  infusion, 250 mL, Intravenous, Continuous, Harris, Whitney D, NP   acyclovir (ZOVIRAX) 700 mg in dextrose 5 % 100 mL IVPB, 700 mg, Intravenous, Q8H, Byrum, Les Pou, MD, Stopped at 02/18/23 1824   [START ON 02/19/2023] cefTRIAXone (ROCEPHIN) 2 g in sodium chloride 0.9 % 100 mL IVPB, 2 g, Intravenous, Q12H, Byrum, Les Pou, MD   Chlorhexidine Gluconate Cloth 2 % PADS 6 each, 6 each, Topical, Daily, Leslye Peer, MD, 6 each at 02/18/23 1538   dexamethasone (DECADRON) injection 4 mg, 4 mg, Intravenous, Q6H, Bowser, Grace E, NP, 4 mg at 02/18/23 1707   docusate (COLACE) 50 MG/5ML liquid 100 mg, 100 mg, Per Tube, BID, Bowser, Kaylyn Layer, NP   docusate sodium (COLACE) capsule 100 mg, 100 mg, Oral, BID PRN, Tiburcio Pea, Whitney D, NP   [START ON 02/19/2023] enoxaparin (LOVENOX) injection 40 mg, 40 mg, Subcutaneous, Q24H, Harris, Whitney D, NP   fentaNYL (SUBLIMAZE) bolus via infusion 50-100 mcg, 50-100 mcg, Intravenous, Q15 min PRN, Terald Sleeper, MD, 50 mcg at 02/18/23 1435   fentaNYL in NS (92mcg/ml) infusion-PREMIX, 50-200 mcg/hr, Intravenous, Continuous, Trifan, Kermit Balo, MD, Last Rate: 15 mL/hr at 02/18/23 1900, 150 mcg/hr at 02/18/23 1900   [START ON 02/19/2023] insulin aspart (novoLOG) injection 0-9 Units, 0-9 Units, Subcutaneous, TID WC, Harris, Whitney D, NP   midazolam (VERSED) injection 2 mg, 2 mg, Intravenous, Q1H PRN, Jefferson Fuel, MD   midazolam (VERSED) injection 5 mg, 5 mg, Intravenous, Once, Jefferson Fuel, MD   norepinephrine (LEVOPHED)  4mg  in (0.016 mg/mL) premix infusion, 2-10 mcg/min, Intravenous, Titrated, Harris, Whitney D, NP, Last Rate: 7.5 mL/hr at 03/09/2023 1944, 2 mcg/min at 03/09/2023 1944   Oral care mouth rinse, 15 mL, Mouth Rinse, Q2H, Bowser, Grace E, NP, 15 mL at March 09, 2023 1946   Oral care mouth rinse, 15 mL, Mouth Rinse, PRN, Bowser, Kaylyn Layer, NP   pantoprazole (PROTONIX) injection 40 mg, 40 mg, Intravenous, Daily, Bowser, Kaylyn Layer, NP, 40 mg at 03-09-2023  1606   polyethylene glycol (MIRALAX / GLYCOLAX) packet 17 g, 17 g, Per Tube, Daily PRN, Harris, Whitney D, NP   polyethylene glycol (MIRALAX / GLYCOLAX) packet 17 g, 17 g, Per Tube, Daily, Bowser, Kaylyn Layer, NP   propofol (DIPRIVAN) 1000 MG/100ML infusion, 5-80 mcg/kg/min (Order-Specific), Intravenous, Continuous, Trifan, Kermit Balo, MD, Last Rate: 34.8 mL/hr at 03/09/23 1900, 80 mcg/kg/min at 03/09/23 1900   vancomycin (VANCOCIN) IVPB 1000 mg/200 mL premix, 1,000 mg, Intravenous, Q8H, Leslye Peer, MD  Vitals   Vitals:   03/09/2023 1900 03/09/23 1915 2023/03/09 1930 09-Mar-2023 1945  BP: 95/63 (!) 87/59 (!) 92/49 (!) 88/55  Pulse: 84 84 79   Resp: (!) 25 (!) 22 (!) 22   Temp: 99 F (37.2 C) 99.1 F (37.3 C) 99.1 F (37.3 C)   TempSrc:      SpO2: 99% 100% 99%   Weight:      Height:        Body mass index is 26.63 kg/m.  Physical Exam   Gen: intubated and just paralyzed  Neurologic Examination   Patient was examined just after intubation and was still paralyzed.  PERRL, (-) corneals/oculocephalics/cough/gag, face symmetric at rest.  No motor response to noxious stimuli in any extremity.  Labs/Imaging/Neurodiagnostic studies   CBC:  Recent Labs  Lab 03-09-2023 1346 03-09-23 1354 2023/03/09 1437  WBC 30.1*  --   --   NEUTROABS 25.9*  --   --   HGB 15.4 16.3  15.6 14.6  HCT 46.2 48.0  46.0 43.0  MCV 91.5  --   --   PLT 388  --   --    Basic Metabolic Panel:  Lab Results  Component Value Date   NA 139 March 09, 2023   K 3.8 March 09, 2023   CO2 16 (L) 03/09/2023   GLUCOSE 212 (H) 2023/03/09   BUN 18 09-Mar-2023   CREATININE 0.90 09-Mar-2023   CALCIUM 8.8 (L) March 09, 2023   GFRNONAA >60 March 09, 2023   GFRAA >60 09/10/2015   Lipid Panel: No results found for: "LDLCALC" HgbA1c: No results found for: "HGBA1C" Urine Drug Screen:     Component Value Date/Time   LABOPIA NONE DETECTED 2023-03-09 1519   COCAINSCRNUR NONE DETECTED 09-Mar-2023 1519   LABBENZ POSITIVE (A) 2023-03-09 1519    AMPHETMU NONE DETECTED 2023-03-09 1519   THCU POSITIVE (A) March 09, 2023 1519   LABBARB NONE DETECTED March 09, 2023 1519    Alcohol Level     Component Value Date/Time   ETH <10 03/09/23 1346   INR No results found for: "INR" APTT No results found for: "APTT" AED levels: No results found for: "PHENYTOIN", "ZONISAMIDE", "LAMOTRIGINE", "LEVETIRACETA"  CT Head without contrast(Personally reviewed): No acute findings  CT angio Head and Neck with contrast(Personally reviewed): negative  ASSESSMENT   Is a 28 year old gentleman with who presents with new onset seizures who presented and convulsive status epilepticus and is now intubated.   RECOMMENDATIONS   - Sedated with propofol and fentanyl - cEEG - Keppra 1500mg  q 12 hrs - Vanc/ceftriaxone/acyclovir for  empiric CNS coverage - LP with cell count in tubes 1 and 4, glucose, protein, gram stain and culture, meningo-encephalitis panel, paraneoplastic panel - MRI brain wwo - Seizure precautions - Will continue to follow  This patient is critically ill and at significant risk of neurological worsening, death and care requires constant monitoring of vital signs, hemodynamics,respiratory and cardiac monitoring, neurological assessment, discussion with family, other specialists and medical decision making of high complexity. I spent 90 minutes of neurocritical care time  in the care of  this patient. This was time spent independent of any time provided by nurse practitioner or PA.  Bing Neighbors, MD [facility] [phone]  If 7pm- 7am, please page neurology on call as listed in AMION.

## 2023-02-18 NOTE — Plan of Care (Signed)
  Problem: Pain Managment: Goal: General experience of comfort will improve and/or be controlled Outcome: Progressing   Problem: Safety: Goal: Ability to remain free from injury will improve Outcome: Progressing   Problem: Skin Integrity: Goal: Risk for impaired skin integrity will decrease Outcome: Progressing

## 2023-02-18 NOTE — ED Provider Notes (Signed)
[place] EMERGENCY DEPARTMENT AT [facility] Provider Note   CSN: [id number] Arrival date & time: 02/18/23  1217     History {Add pertinent medical, surgical, social history, OB history to HPI:1} Chief Complaint  Patient presents with   Seizures    Eric Arellano is a 28 y.o. male presenting to the ED with concern for seizure-like activity.  History is provided by EMS as well as the patient's mother and family members in the ED.  They report that the patient called complaining of an acute onset severe headache that began around 10 AM this morning.  Mother was with the patient around 56 AM when she noted his eyes rolled back in his head and he had generalized clenching of his arms and legs.  EMS reports on their arrival the patient was confused but sitting up, diaphoretic, rubbing his head, but not able to verbalize with them.  They report the patient had bizarre behavior en route to the hospital, not following commands, but repeatedly gesturing or pulling on his genitals.  On arrival the patient had another witnessed seizure by staff members immediately prior to my entering the room.  He is on a bag mask and not able to provide any further history to me.  POC Glucose >100 per EMS  His family members report there is a family history of a "AVM" in a distant family member but no immediate family history of aneurysm.  The patient does vape and smoke nicotine.  No report of illicit drug use.  His mother reports the patient has had 2 syncope episodes in the past 1 to 2 months reported by his roommates.  There is no history of seizures.  There was no reported head trauma.  Patient was reportedly feeling well yesterday.  Of note, EMS did give the patient 0.5 mg of Narcan on arrival with no improvement of his mental status.  He was not given any benzos by EMS.  HPI     Home Medications Prior to Admission medications   Medication Sig Start Date End Date Taking? Authorizing Provider   chlorhexidine (PERIDEX) 0.12 % solution Use as directed 15 mLs in the mouth or throat 2 (two) times daily. 03/23/19   Cathie Hoops, Amy V, PA-C  diclofenac (VOLTAREN) 50 MG EC tablet Take 1 tablet (50 mg total) by mouth 2 (two) times daily. 03/23/19   Belinda Fisher, PA-C      Allergies    Patient has no known allergies.    Review of Systems   Review of Systems  Physical Exam Updated Vital Signs BP (!) 94/59   Pulse 67   Temp (!) 96.6 F (35.9 C)   Resp 20   Ht 5\' 5"  (1.651 m)   Wt 70.7 kg   SpO2 100%   BMI 25.94 kg/m  Physical Exam Constitutional:      Comments: Diaphoretic, nonresponsive  HENT:     Head: Normocephalic and atraumatic.  Eyes:     Conjunctiva/sclera: Conjunctivae normal.     Pupils: Pupils are equal, round, and reactive to light.  Cardiovascular:     Rate and Rhythm: Normal rate and regular rhythm.  Pulmonary:     Effort: Pulmonary effort is normal. No respiratory distress.  Abdominal:     General: There is no distension.     Tenderness: There is no abdominal tenderness.  Skin:    General: Skin is warm.  Neurological:     Comments: GCS 3 on arrival, PERRL, gag reflex  present     ED Results / Procedures / Treatments   Labs (all labs ordered are listed, but only abnormal results are displayed) Labs Reviewed  BASIC METABOLIC PANEL - Abnormal; Notable for the following components:      Result Value   CO2 16 (*)    Glucose, Bld 212 (*)    Calcium 8.8 (*)    All other components within normal limits  CBC WITH DIFFERENTIAL/PLATELET - Abnormal; Notable for the following components:   WBC 30.1 (*)    Neutro Abs 25.9 (*)    Monocytes Absolute 1.8 (*)    All other components within normal limits  HEPATIC FUNCTION PANEL - Abnormal; Notable for the following components:   Bilirubin, Direct 0.4 (*)    All other components within normal limits  I-STAT VENOUS BLOOD GAS, ED - Abnormal; Notable for the following components:   pCO2, Ven 38.5 (*)    pO2, Ven 104 (*)     Bicarbonate 17.5 (*)    TCO2 19 (*)    Acid-base deficit 9.0 (*)    Calcium, Ion 1.08 (*)    All other components within normal limits  I-STAT CHEM 8, ED - Abnormal; Notable for the following components:   Glucose, Bld 212 (*)    Calcium, Ion 1.06 (*)    TCO2 18 (*)    All other components within normal limits  CBG MONITORING, ED - Abnormal; Notable for the following components:   Glucose-Capillary 103 (*)    All other components within normal limits  I-STAT ARTERIAL BLOOD GAS, ED - Abnormal; Notable for the following components:   pH, Arterial 7.204 (*)    pCO2 arterial 51.3 (*)    pO2, Arterial 326 (*)    Acid-base deficit 8.0 (*)    All other components within normal limits  RESP PANEL BY RT-PCR (RSV, FLU A&B, COVID)  RVPGX2  CSF CULTURE W GRAM STAIN  CULTURE, BLOOD (ROUTINE X 2)  CULTURE, BLOOD (ROUTINE X 2)  ETHANOL  RAPID URINE DRUG SCREEN, HOSP PERFORMED  URINALYSIS, ROUTINE W REFLEX MICROSCOPIC  BLOOD GAS, ARTERIAL  PROTEIN AND GLUCOSE, CSF  MENINGITIS/ENCEPHALITIS PANEL (CSF)  CSF CELL COUNT WITH DIFFERENTIAL  CSF CELL COUNT WITH DIFFERENTIAL  MISC [facility] TEST (SEND OUT)  HIV ANTIBODY (ROUTINE TESTING W REFLEX)  BLOOD GAS, ARTERIAL  MAGNESIUM    EKG EKG Interpretation Date/Time:  Sunday February 18 2023 14:30:45 EST Ventricular Rate:  71 PR Interval:  144 QRS Duration:  106 QT Interval:  421 QTC Calculation: 458 R Axis:   74  Text Interpretation: Sinus rhythm Confirmed by Alvester Chou [phone]) on 02/18/2023 3:17:23 PM  Radiology DG Chest Portable 1 View Result Date: 02/18/2023 CLINICAL DATA:  Intubation EXAM: PORTABLE CHEST 1 VIEW COMPARISON:  02/02/2015 FINDINGS: Endotracheal tube terminates 1.2 cm above the carina. Heart size within normal limits. Low lung volumes. No focal airspace consolidation, pleural effusion, or pneumothorax. IMPRESSION: 1. Endotracheal tube terminates 1.2 cm above the carina. Recommend retraction by 2 cm. 2. Low lung volumes.  Electronically Signed   By: Duanne Guess D.O.   On: 02/18/2023 14:50   CT Angio Head W or Wo Contrast Result Date: 02/18/2023 CLINICAL DATA:  Cerebral aneurysm screening, high-risk. Headache. Seizure. EXAM: CT ANGIOGRAPHY HEAD TECHNIQUE: Multidetector CT imaging of the head was performed using the standard protocol during bolus administration of intravenous contrast. Multiplanar CT image reconstructions and MIPs were obtained to evaluate the vascular anatomy. RADIATION DOSE REDUCTION: This exam was performed according to  the departmental dose-optimization program which includes automated exposure control, adjustment of the mA and/or kV according to patient size and/or use of iterative reconstruction technique. CONTRAST:  75mL OMNIPAQUE IOHEXOL 350 MG/ML SOLN COMPARISON:  CTA head and neck 05/14/2020 FINDINGS: Anterior circulation: The included distal cervical and intracranial portions of the internal carotid arteries are widely patent. ACAs and MCAs are patent without evidence of a proximal branch occlusion or significant proximal stenosis. There is an early bifurcation of the left MCA. No aneurysm is identified. Posterior circulation: The included portions of the distal vertebral arteries are widely patent to the basilar and codominant. The basilar artery is widely patent. There are small right and large left posterior communicating arteries with hypoplasia of the left P1 segment. Both PCAs are patent without evidence of a significant proximal stenosis. No aneurysm is identified. Venous sinuses: As permitted by contrast timing, patent. Anatomic variants: Fetal origin of the left PCA. Review of the MIP images confirms the above findings. IMPRESSION: Negative CTA of the head. Electronically Signed   By: Sebastian Ache M.D.   On: 02/18/2023 13:39   CT Head Wo Contrast Result Date: 02/18/2023 CLINICAL DATA:  28 year old male with history of neurologic deficit. History of trauma. Seizure. EXAM: CT HEAD WITHOUT  CONTRAST CT CERVICAL SPINE WITHOUT CONTRAST TECHNIQUE: Multidetector CT imaging of the head and cervical spine was performed following the standard protocol without intravenous contrast. Multiplanar CT image reconstructions of the cervical spine were also generated. RADIATION DOSE REDUCTION: This exam was performed according to the departmental dose-optimization program which includes automated exposure control, adjustment of the mA and/or kV according to patient size and/or use of iterative reconstruction technique. COMPARISON:  Head CT 09/10/2015. FINDINGS: CT HEAD FINDINGS Brain: No evidence of acute infarction, hemorrhage, hydrocephalus, extra-axial collection or mass lesion/mass effect. Vascular: No hyperdense vessel or unexpected calcification. Skull: Normal. Negative for fracture or focal lesion. Sinuses/Orbits: No acute finding. Other: None. CT CERVICAL SPINE FINDINGS Alignment: Normal. Skull base and vertebrae: No acute fracture. No primary bone lesion or focal pathologic process. Soft tissues and spinal canal: No prevertebral fluid or swelling. No visible canal hematoma. Disc levels: Multilevel degenerative disc disease, most pronounced at C6-C7. Mild multilevel facet arthropathy. Upper chest: Unremarkable. Other: Patient is intubated. IMPRESSION: 1. No acute intracranial abnormalities. 2. No evidence of acute traumatic injury to the cervical spine. 3. Mild multilevel degenerative disc disease and cervical spondylosis, as above. Electronically Signed   By: Trudie Reed M.D.   On: 02/18/2023 13:33   CT Cervical Spine Wo Contrast Result Date: 02/18/2023 CLINICAL DATA:  28 year old male with history of neurologic deficit. History of trauma. Seizure. EXAM: CT HEAD WITHOUT CONTRAST CT CERVICAL SPINE WITHOUT CONTRAST TECHNIQUE: Multidetector CT imaging of the head and cervical spine was performed following the standard protocol without intravenous contrast. Multiplanar CT image reconstructions of the  cervical spine were also generated. RADIATION DOSE REDUCTION: This exam was performed according to the departmental dose-optimization program which includes automated exposure control, adjustment of the mA and/or kV according to patient size and/or use of iterative reconstruction technique. COMPARISON:  Head CT 09/10/2015. FINDINGS: CT HEAD FINDINGS Brain: No evidence of acute infarction, hemorrhage, hydrocephalus, extra-axial collection or mass lesion/mass effect. Vascular: No hyperdense vessel or unexpected calcification. Skull: Normal. Negative for fracture or focal lesion. Sinuses/Orbits: No acute finding. Other: None. CT CERVICAL SPINE FINDINGS Alignment: Normal. Skull base and vertebrae: No acute fracture. No primary bone lesion or focal pathologic process. Soft tissues and spinal canal: No prevertebral fluid  or swelling. No visible canal hematoma. Disc levels: Multilevel degenerative disc disease, most pronounced at C6-C7. Mild multilevel facet arthropathy. Upper chest: Unremarkable. Other: Patient is intubated. IMPRESSION: 1. No acute intracranial abnormalities. 2. No evidence of acute traumatic injury to the cervical spine. 3. Mild multilevel degenerative disc disease and cervical spondylosis, as above. Electronically Signed   By: Trudie Reed M.D.   On: 02/18/2023 13:33    Procedures Lumbar Puncture  Date/Time: 02/18/2023 3:14 PM  Performed by: Terald Sleeper, MD Authorized by: Terald Sleeper, MD   Consent:    Consent obtained:  Verbal   Consent given by:  Parent (Mother)   Risks, benefits, and alternatives were discussed: yes     Risks discussed:  Bleeding, headache, infection, nerve damage, repeat procedure and pain Universal protocol:    Procedure explained and questions answered to patient or proxy's satisfaction: yes     Relevant documents present and verified: yes     Test results available: yes     Imaging studies available: yes     Required blood products, implants,  devices, and special equipment available: yes     Immediately prior to procedure a time out was called: yes     Site/side marked: yes     Patient identity confirmed:  Arm band Pre-procedure details:    Procedure purpose:  Diagnostic   Preparation: Patient was prepped and draped in usual sterile fashion   Sedation:    Sedation type:  Deep Anesthesia:    Anesthesia method:  None Procedure details:    Lumbar space:  L4-L5 interspace   Patient position:  L lateral decubitus   Needle gauge:  20   Needle length (in):  2.5   Ultrasound guidance: no     Number of attempts:  1   Fluid appearance:  Clear   Tubes of fluid:  4   Total volume (ml):  10 Post-procedure details:    Puncture site:  Direct pressure applied and adhesive bandage applied   Procedure completion:  Tolerated well, no immediate complications Procedure Name: Intubation Date/Time: 02/18/2023 3:15 PM  Performed by: Terald Sleeper, MDPre-anesthesia Checklist: Patient identified, Patient being monitored, Emergency Drugs available, Timeout performed and Suction available Oxygen Delivery Method: Non-rebreather mask Preoxygenation: Pre-oxygenation with 100% oxygen Induction Type: Rapid sequence Ventilation: Mask ventilation without difficulty Laryngoscope Size: Glidescope and 4 Tube size: 7.5 mm Number of attempts: 1 Airway Equipment and Method: Video-laryngoscopy Placement Confirmation: ETT inserted through vocal cords under direct vision, CO2 detector and Breath sounds checked- equal and bilateral Secured at: 26 cm Tube secured with: ETT holder    .Critical Care  Performed by: Terald Sleeper, MD Authorized by: Terald Sleeper, MD   Critical care provider statement:    Critical care time (minutes):  50   Critical care time was exclusive of:  Separately billable procedures and treating other patients   Critical care was necessary to treat or prevent imminent or life-threatening deterioration of the following  conditions:  CNS failure or compromise   Critical care was time spent personally by me on the following activities:  Ordering and performing treatments and interventions, ordering and review of laboratory studies, ordering and review of radiographic studies, pulse oximetry, review of old charts, examination of patient and evaluation of patient's response to treatment Comments:     Status epilepticus management   {Document cardiac monitor, telemetry assessment procedure when appropriate:1}  Medications Ordered in ED Medications  etomidate (AMIDATE) injection (30 mg Intravenous Given  02/18/23 1237)  rocuronium (ZEMURON) injection (70 mg Intravenous Given 02/18/23 1238)  propofol (DIPRIVAN) 1000 MG/100ML infusion ( Intravenous New Bag/Given 02/18/23 1243)  vancomycin (VANCOREADY) IVPB 1500 mg/300 mL (1,500 mg Intravenous New Bag/Given 02/18/23 1357)  midazolam (VERSED) injection 5 mg (5 mg Intravenous Not Given 02/18/23 1401)  fentaNYL in NS (8mcg/ml) infusion-PREMIX (100 mcg/hr Intravenous Rate/Dose Change 02/18/23 1434)  fentaNYL (SUBLIMAZE) bolus via infusion 50-100 mcg (50 mcg Intravenous Bolus from Bag 02/18/23 1435)  docusate sodium (COLACE) capsule 100 mg (has no administration in time range)  polyethylene glycol (MIRALAX / GLYCOLAX) packet 17 g (has no administration in time range)  enoxaparin (LOVENOX) injection 40 mg (has no administration in time range)  dexamethasone (DECADRON) injection 4 mg (has no administration in time range)  docusate (COLACE) 50 MG/5ML liquid 100 mg (has no administration in time range)  polyethylene glycol (MIRALAX / GLYCOLAX) packet 17 g (has no administration in time range)  pantoprazole (PROTONIX) injection 40 mg (has no administration in time range)  Oral care mouth rinse (has no administration in time range)  Oral care mouth rinse (has no administration in time range)  0.9 %  sodium chloride infusion (has no administration in time range)  acyclovir  (ZOVIRAX) 700 mg in dextrose 5 % 100 mL IVPB (has no administration in time range)  LORazepam (ATIVAN) 2 MG/ML injection (  Given 02/18/23 1230)  fentaNYL (SUBLIMAZE) injection 50 mcg (50 mcg Intravenous Not Given 02/18/23 1333)  iohexol (OMNIPAQUE) 350 MG/ML injection 75 mL (75 mLs Intravenous Contrast Given 02/18/23 1326)  levETIRAcetam (KEPPRA) IVPB 1000 mg/100 mL premix (0 mg Intravenous Stopped 02/18/23 1400)  cefTRIAXone (ROCEPHIN) 2 g in sodium chloride 0.9 % 100 mL IVPB (2 g Intravenous New Bag/Given 02/18/23 1445)  midazolam PF (VERSED) 10 MG/2ML injection (10 mg  Given 02/18/23 1353)  fentaNYL (SUBLIMAZE) injection 100 mcg (100 mcg Intravenous Given 02/18/23 1358)    ED Course/ Medical Decision Making/ A&P Clinical Course as of 02/18/23 1519  Sun Feb 18, 2023  1300 Patient given IM ativan for seizure-like activity by RN staff on arrival.  He is now intubated, sedated on propofol - CT technician able to perform scan.  Pt's RN aware of need for stat transport for CT.  Family members updated about his intubation status. [MT]  1322 Dr Selina Cooley neuro consulted - will see pt for EEG monitoring.  I do not see evidence of acute blood products on CT imaging of the dead [MT]  1335 Neurologist requesting diagnostic LP for infectious rule out - family to be consented for procedure [MT]  1355 Neuro at bedside, Keppra started, pt with rhythmic jerking bilateral hands and head, potential seizure-like activity. [MT]  1356 BP 140/70 at this time, pt on propofol infusion 35 mcg, also ordered fentanyl bolus and infusion for ensured sedation [MT]  1400 Paged critical care [MT]  1413 Critical care at bedside.  Rhythmic seizure like activity has abated.  Will attempt LP [MT]  1459 LP successful clear CSF collected, pt to be taken to medical ICU [MT]    Clinical Course User Index [MT] Deziyah Arvin, Kermit Balo, MD   {   Click here for ABCD2, HEART and other calculatorsREFRESH Note before signing :1}                               Medical Decision Making Amount and/or Complexity of Data Reviewed Labs: ordered. Radiology: ordered. ECG/medicine tests: ordered.  Risk Prescription drug management. Decision regarding hospitalization.   This patient presents to the ED with concern for seizure-like activity, altered mental status. This involves an extensive number of treatment options, and is a complaint that carries with it a high risk of complications and morbidity.  The differential diagnosis includes intracranial hemorrhage versus intracranial infection versus complex migraine headache for status epilepticus versus other  Additional history obtained from EMS, patient's family at the bedside  I ordered and personally interpreted labs.  The pertinent results include: White blood cell count 30.1.  Venous gas with pH 7.2, pCO2 51.3.  Glucose wnl.  CSF studies drawn and pending  Increased RR on ventilatory from 16 -> 20, weaning FIO2 settings, patient ventilated at 8 cc/kg tidal volume per RT at the time of ICU admission  I ordered imaging studies including CT head and cervical spine, CTA of the head I independently visualized and interpreted imaging which showed no evidence of aneurysm, no evidence of acute blood products, no spinal fracture I agree with the radiologist interpretation  The patient was maintained on a cardiac monitor.  I personally viewed and interpreted the cardiac monitored which showed an underlying rhythm of: Sinus rhythm and intermittent sinus tachycardia with PACs  Per my interpretation the patient's ECG shows sinus rhythm with no acute ischemic findings  I ordered medication including rocuronium and etomidate for rapid sequence intubation.  IM Ativan was given initially for seizure-like activity, followed by IV Versed for persistent seizure activity after intubation and CT scans.  IV Keppra loading dose given in conjunction with neurology consultation.  Patient was sedated and given analgesics  with IV fentanyl and IV propofol.  IV antibiotics ordered for sepsis and meningitis coverage, including vancomycin and IV Rocephin.  I have reviewed the patients home medicines and have made adjustments as needed  Test Considered: Lower suspicion for acute PE in this clinical setting  I requested consultation with the neurology,  and discussed lab and imaging findings as well as pertinent plan - they recommend: Diagnostic lumbar puncture, continuous EEG monitoring, medical admission to ICU,  After the interventions noted above, I reevaluated the patient and found that they have: stayed the same -seizure-like clenching activity appears to have abated after the Versed and Keppra medication given at the time of admission to the medical ICU   Dispostion:  After consideration of the diagnostic results and the patients response to treatment, I feel that the patent would benefit from medical admission.   {Document critical care time when appropriate:1} {Document review of labs and clinical decision tools ie heart score, Chads2Vasc2 etc:1}  {Document your independent review of radiology images, and any outside records:1} {Document your discussion with family members, caretakers, and with consultants:1} {Document social determinants of health affecting pt's care:1} {Document your decision making why or why not admission, treatments were needed:1} Final Clinical Impression(s) / ED Diagnoses Final diagnoses:  None    Rx / DC Orders ED Discharge Orders     None

## 2023-02-18 NOTE — ED Triage Notes (Signed)
Pt from home. Pt called family and stated he was having migraine, when EMS arrived pt seized. No hx of seizures. Pt uses marijuana. EMS gave 0.5 narcan. VSS. Pt arrives post ictal.

## 2023-02-18 NOTE — Progress Notes (Signed)
Patient transported from ED33 to 4N31 with RT and RN at bedside, no complications noted.

## 2023-02-18 NOTE — ED Notes (Signed)
Pt actively seizing, MD pulled to bedside.

## 2023-02-19 ENCOUNTER — Other Ambulatory Visit (HOSPITAL_COMMUNITY): Payer: Self-pay

## 2023-02-19 ENCOUNTER — Inpatient Hospital Stay (HOSPITAL_COMMUNITY): Payer: Self-pay

## 2023-02-19 DIAGNOSIS — R569 Unspecified convulsions: Secondary | ICD-10-CM

## 2023-02-19 LAB — CBC
HCT: 39.5 % (ref 39.0–52.0)
Hemoglobin: 13.6 g/dL (ref 13.0–17.0)
MCH: 30.7 pg (ref 26.0–34.0)
MCHC: 34.4 g/dL (ref 30.0–36.0)
MCV: 89.2 fL (ref 80.0–100.0)
Platelets: 327 10*3/uL (ref 150–400)
RBC: 4.43 MIL/uL (ref 4.22–5.81)
RDW: 13 % (ref 11.5–15.5)
WBC: 14 10*3/uL — ABNORMAL HIGH (ref 4.0–10.5)
nRBC: 0 % (ref 0.0–0.2)

## 2023-02-19 LAB — BASIC METABOLIC PANEL
Anion gap: 8 (ref 5–15)
BUN: 10 mg/dL (ref 6–20)
CO2: 19 mmol/L — ABNORMAL LOW (ref 22–32)
Calcium: 8.5 mg/dL — ABNORMAL LOW (ref 8.9–10.3)
Chloride: 110 mmol/L (ref 98–111)
Creatinine, Ser: 0.84 mg/dL (ref 0.61–1.24)
GFR, Estimated: >60 mL/min (ref >60)
Glucose, Bld: 127 mg/dL — ABNORMAL HIGH (ref 70–99)
Potassium: 4.1 mmol/L (ref 3.5–5.1)
Sodium: 137 mmol/L (ref 135–145)

## 2023-02-19 LAB — CSF CELL COUNT WITH DIFFERENTIAL
RBC Count, CSF: 1 /mm3 — ABNORMAL HIGH
Tube #: 4
WBC, CSF: 2 /mm3 (ref 0–5)

## 2023-02-19 LAB — GLUCOSE, CAPILLARY
Glucose-Capillary: 118 mg/dL — ABNORMAL HIGH (ref 70–99)
Glucose-Capillary: 119 mg/dL — ABNORMAL HIGH (ref 70–99)
Glucose-Capillary: 120 mg/dL — ABNORMAL HIGH (ref 70–99)
Glucose-Capillary: 121 mg/dL — ABNORMAL HIGH (ref 70–99)
Glucose-Capillary: 129 mg/dL — ABNORMAL HIGH (ref 70–99)
Glucose-Capillary: 86 mg/dL (ref 70–99)

## 2023-02-19 MED ORDER — DEXMEDETOMIDINE HCL IN NACL 400 MCG/100ML IV SOLN
0.0000 ug/kg/h | INTRAVENOUS | Status: DC
  Administered 2023-02-19: 0.4 ug/kg/h via INTRAVENOUS
  Filled 2023-02-19: qty 100

## 2023-02-19 MED ORDER — LORAZEPAM 2 MG/ML IJ SOLN
2.0000 mg | Freq: Once | INTRAMUSCULAR | Status: AC
  Administered 2023-02-19: 2 mg via INTRAVENOUS
  Filled 2023-02-19: qty 1

## 2023-02-19 MED ORDER — PROMETHAZINE HCL 25 MG PO TABS
25.0000 mg | ORAL_TABLET | Freq: Four times a day (QID) | ORAL | Status: DC | PRN
  Administered 2023-02-19: 25 mg via ORAL
  Filled 2023-02-19: qty 1

## 2023-02-19 MED ORDER — MELATONIN 5 MG PO TABS
5.0000 mg | ORAL_TABLET | Freq: Every evening | ORAL | Status: DC | PRN
  Administered 2023-02-19: 5 mg via ORAL
  Filled 2023-02-19: qty 1

## 2023-02-19 MED ORDER — POLYETHYLENE GLYCOL 3350 17 G PO PACK: 17.0000 g | PACK | Freq: Every day | ORAL | Status: DC | PRN

## 2023-02-19 MED ORDER — ONDANSETRON HCL 4 MG/2ML IJ SOLN
4.0000 mg | Freq: Four times a day (QID) | INTRAMUSCULAR | Status: DC | PRN
  Administered 2023-02-19: 4 mg via INTRAVENOUS

## 2023-02-19 MED ORDER — POLYETHYLENE GLYCOL 3350 17 G PO PACK: 17.0000 g | PACK | Freq: Every day | ORAL | Status: DC

## 2023-02-19 MED ORDER — ONDANSETRON HCL 4 MG/2ML IJ SOLN: 4.0000 mg | Freq: Four times a day (QID) | INTRAMUSCULAR | Status: DC

## 2023-02-19 MED ORDER — ONDANSETRON HCL 4 MG/2ML IJ SOLN
INTRAMUSCULAR | Status: AC
  Filled 2023-02-19: qty 2

## 2023-02-19 MED ORDER — DOCUSATE SODIUM 100 MG PO CAPS: 100.0000 mg | ORAL_CAPSULE | Freq: Two times a day (BID) | ORAL | Status: DC

## 2023-02-19 MED ORDER — LACOSAMIDE 50 MG PO TABS
50.0000 mg | ORAL_TABLET | Freq: Two times a day (BID) | ORAL | Status: DC
  Administered 2023-02-19 – 2023-02-20 (×3): 50 mg via ORAL
  Filled 2023-02-19 (×3): qty 1

## 2023-02-19 MED ORDER — TRAZODONE HCL 50 MG PO TABS: 100.0000 mg | ORAL_TABLET | Freq: Every evening | ORAL | Status: DC | PRN

## 2023-02-19 NOTE — Progress Notes (Signed)
RT NOTE: RT arrived to room to extubate PT but PT had just self extubated. RT placed PT on 4L [place]. BBS clear with no stridor heard. Vitals are stable. RT will continue to monitor.

## 2023-02-19 NOTE — Progress Notes (Addendum)
Subjective: No seizures overnight.  Patient self extubated this morning.  ROS: negative except above  Examination  Vital signs in last 24 hours: Temp:  [95.6 F (35.3 C)-99.5 F (37.5 C)] 99.3 F (37.4 C) (02/03 1000) Pulse Rate:  [51-139] 51 (02/03 1100) Resp:  [16-35] 18 (02/03 1100) BP: (87-209)/(49-132) 105/64 (02/03 1100) SpO2:  [90 %-100 %] 94 % (02/03 1100) FiO2 (%):  [36 %-100 %] 36 % (02/03 0953) Weight:  [70.7 kg-72.6 kg] 72.5 kg (02/03 0500)  General: lying in bed, NAD Neuro: MS: Alert, oriented, follows commands CN: pupils equal and reactive,  EOMI, face symmetric, tongue midline, normal sensation over face, Motor: 5/5 strength in all 4 extremities Coordination: normal Gait: not tested  Basic Metabolic Panel: Recent Labs  Lab 02/18/23 1346 02/18/23 1354 02/18/23 1437 02/18/23 1956 02/19/23 0438  NA 135 138  137 139  --  137  K 4.3 4.3  4.2 3.8  --  4.1  CL 106 110  --   --  110  CO2 16*  --   --   --  19*  GLUCOSE 212* 212*  --   --  127*  BUN 15 18  --   --  10  CREATININE 0.95 0.90  --   --  0.84  CALCIUM 8.8*  --   --   --  8.5*  MG  --   --   --  2.0  --     CBC: Recent Labs  Lab 02/18/23 1346 02/18/23 1354 02/18/23 1437 02/19/23 0438  WBC 30.1*  --   --  14.0*  NEUTROABS 25.9*  --   --   --   HGB 15.4 16.3  15.6 14.6 13.6  HCT 46.2 48.0  46.0 43.0 39.5  MCV 91.5  --   --  89.2  PLT 388  --   --  327     Coagulation Studies: No results for input(s): "LABPROT", "INR" in the last 72 hours.  Imaging personally reviewed  CT head without contrast 02/18/2023: No acute intracranial abnormalities.   CT head with and without contrast 02/18/2023: Negative CTA of the head.  ASSESSMENT AND PLAN: 28 year old male with past medical history of asthma, nicotine use and marijuana use who presented with new onset status epilepticus which has since resolved.  New onset status epilepticus - No clear etiology.  LP negative for  infection  Recommendations -Okay to stop sedation -DC LTM EEG -Patient is on Keppra.  Discussed potential side effects of Keppra.  Per patient and family, he would prefer to be on a different antiseizure medication due to concern for potentially worsening mood on Keppra -Will switch to Vimpat 50 mg twice daily.  EKG reviewed -Rescue medication: Clonazepam 2 mg oral dissolving tablet for seizure lasting more than 2 minutes -MRI brain with and without contrast to look for any acute abnormality -Seizure precautions including no driving -Discussed plan with Dr. Denese Killings via secure chat -Discussed plan with patient and family at bedside -Follow-up with neurology in 2 to 3 months (order placed)  Seizure precautions: Per [facility] statutes, patients with seizures are not allowed to drive until they have been seizure-free for six months and cleared by a physician    Use caution when using heavy equipment or power tools. Avoid working on ladders or at heights. Take showers instead of baths. Ensure the water temperature is not too high on the home water heater. Do not go swimming alone. Do not lock yourself in  a room alone (i.e. bathroom). When caring for infants or small children, sit down when holding, feeding, or changing them to minimize risk of injury to the child in the event you have a seizure. Maintain good sleep hygiene. Avoid alcohol.    If patient has another seizure, call 911 and bring them back to the ED if: A.  The seizure lasts longer than 5 minutes.      B.  The patient doesn't wake shortly after the seizure or has new problems such as difficulty seeing, speaking or moving following the seizure C.  The patient was injured during the seizure D.  The patient has a temperature over 102 F (39C) E.  The patient vomited during the seizure and now is having trouble breathing    During the Seizure   - First, ensure adequate ventilation and place patients on the floor on their left  side  Loosen clothing around the neck and ensure the airway is patent. If the patient is clenching the teeth, do not force the mouth open with any object as this can cause severe damage - Remove all items from the surrounding that can be hazardous. The patient may be oblivious to what's happening and may not even know what he or she is doing. If the patient is confused and wandering, either gently guide him/her away and block access to outside areas - Reassure the individual and be comforting - Call 911. In most cases, the seizure ends before EMS arrives. However, there are cases when seizures may last over 3 to 5 minutes. Or the individual may have developed breathing difficulties or severe injuries. If a pregnant patient or a person with diabetes develops a seizure, it is prudent to call an ambulance. - Finally, if the patient does not regain full consciousness, then call EMS. Most patients will remain confused for about 45 to 90 minutes after a seizure, so you must use judgment in calling for help.     After the Seizure (Postictal Stage)   After a seizure, most patients experience confusion, fatigue, muscle pain and/or a headache. Thus, one should permit the individual to sleep. For the next few days, reassurance is essential. Being calm and helping reorient the person is also of importance.   Most seizures are painless and end spontaneously. Seizures are not harmful to others but can lead to complications such as stress on the lungs, brain and the heart. Individuals with prior lung problems may develop labored breathing and respiratory distress.        I have spent a total of   38 minutes with the patient reviewing hospital notes,  test results, labs and examining the patient as well as establishing an assessment and plan that was discussed personally with the patient.  > 50% of time was spent in direct patient care.     Lindie Spruce Epilepsy [facility] For questions  after 5pm please refer to AMION to reach the Neurologist on call

## 2023-02-19 NOTE — Progress Notes (Signed)
NAME:  Eric Arellano, MRN:  [id number], DOB:  [birth year], LOS: 1 ADMISSION DATE:  02/18/2023, CONSULTATION DATE:  02/18/2023 REFERRING MD:  Dr. Renaye Rakers - EDP, CHIEF COMPLAINT:  Status epilepticus    History of Present Illness:  Eric Arellano is a 28 y.o. male with little past medical history besides tobacco/vape use, asthma.  He was well until he called family 02/18/2023 reporting acute onset of a severe headache around 10 AM.  Mother checked him at 63 AM and he had tonic-clonic movements with eyes rolled back in his head.  EMS was called and when they arrived he was confused, postictal, unable to converse with them.  Confused and unable to follow commands and route to the hospital.  In the ED had another witnessed seizure by staff and then prolonged tonic-clonic episode.  No reported trauma.  Per mother's report he has had 2 syncope episodes in the past 1 to 2 months witnessed by his roommates but no history of overt seizure activity.  He has not had any head trauma, new medications, etc.  No history of drug use other than tobacco/vape.  Drug screen is pending  He was emergently intubated in the ED, received midazolam, then started on propofol and fentanyl for sedation, loaded on Keppra.  Head CT/angio head and neck were normal without any evidence of acute traumatic injury to the C-spine.  LP performed.  Has been started on ceftriaxone, vancomycin, acyclovir and dexamethasone.  Autoimmune encephalitis panel will also be sent.  Pertinent  Medical History   Past Medical History:  Diagnosis Date   Asthma    Family history: Family members report that there is a family history of AVMs, no known history of aneurysm  Significant Hospital Events: Including procedures, antibiotic start and stop dates in addition to other pertinent events   Head CT 2/2 > no acute abnormalities, no evidence of C-spine injury CT angio Head and neck 2/2 >> negative for any evidence of vascular abnormality or occlusion.   No evidence of aneurysm 2/2 - LP clear CSF with no WBC. Negative CSF panel.   Interim History / Subjective:  Following commands through sedation.   Objective   Blood pressure 111/66, pulse (!) 58, temperature 99.1 F (37.3 C), resp. rate (!) 22, height 5\' 5"  (1.651 m), weight 72.5 kg, SpO2 98%.    Vent Mode: PRVC FiO2 (%):  [40 %-100 %] 40 % Set Rate:  [16 bmp-22 bmp] 22 bmp Vt Set:  [500 mL] 500 mL PEEP:  [5 cmH20] 5 cmH20 Plateau Pressure:  [13 cmH20-14 cmH20] 13 cmH20   Intake/Output Summary (Last 24 hours) at 02/19/2023 0813 Last data filed at 02/19/2023 0700 Gross per 24 hour  Intake 3646.91 ml  Output 2100 ml  Net 1546.91 ml   Filed Weights   02/18/23 1506 02/18/23 1800 02/19/23 0500  Weight: 70.7 kg 72.6 kg 72.5 kg    Examination: General: Thin man HENT: ET tube in good position.  No evidence of tongue laceration. OGT in place Lungs: Clear bilaterally. Still too sedated for SBT Cardiovascular: Normal HS Abdomen: Soft Extremities: No edema Neuro: Somnolent, eyes closed but following commands. Periodic agitation. Moves all limbs   Ancillary Tests Personally Reviewed:  Mild metabolic acidosis 19 WBC 14.   Assessment & Plan:   Status epilepticus - with possible prior undiagnosed episodes.  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  - If EEG negative for further seizures, will discuss weaning sedation off and proceeding to extubation.  May need transition to Precedex to manage agitation during propofol wean/SBT - Will need MRI to complete seizure work-up, but can potentially wait until after extubated.   Best Practice (right click and "Reselect all SmartList Selections" daily)   Diet/type: NPO DVT prophylaxis LMWH Pressure ulcer(s): N/A GI prophylaxis: PPI Lines: N/A Foley:  Yes, and it is  still needed Code Status:  full code Last date of multidisciplinary goals of care discussion [pending]  CRITICAL CARE Performed by: Lynnell Catalan   Total critical care time: 40 minutes  Critical care time was exclusive of separately billable procedures and treating other patients.  Critical care was necessary to treat or prevent imminent or life-threatening deterioration.  Critical care was time spent personally by me on the following activities: development of treatment plan with patient and/or surrogate as well as nursing, discussions with consultants, evaluation of patient's response to treatment, examination of patient, obtaining history from patient or surrogate, ordering and performing treatments and interventions, ordering and review of laboratory studies, ordering and review of radiographic studies, pulse oximetry, re-evaluation of patient's condition and participation in multidisciplinary rounds.  Lynnell Catalan, MD [facility] ICU Physician [facility]  Pager: [phone] Mobile: [phone] After hours: [phone].

## 2023-02-19 NOTE — TOC CM/SW Note (Signed)
Transition of Care [facility]) - Inpatient Brief Assessment   Patient Details  Name: RAJEEV ESCUE MRN: [id number] Date of Birth: 06-12-1995  Transition of Care [facility]) CM/SW Contact:    Glennon Mac, RN Phone Number: 02/19/2023, 5:25 PM   Clinical Narrative: 28 year old male with past medical history of asthma, nicotine use and marijuana use who presented with new onset status epilepticus which has since resolved. Patient self-extubated this morning; anticipate dc 1-2 days. TOC will follow for discharge needs.     Transition of Care Asessment: Insurance and Status: Selfpay Patient has primary care physician: No Home environment has been reviewed: Lives with roommates Prior level of function:: Independent Prior/Current Home Services: No current home services Social Drivers of Health Review: SDOH reviewed needs interventions Readmission risk has been reviewed: Yes Transition of care needs: transition of care needs identified, TOC will continue to follow  Quintella Baton, RN, BSN  Trauma/Neuro ICU Case Manager [phone]

## 2023-02-19 NOTE — Progress Notes (Signed)
 LTM EEG discontinued - no skin breakdown at [facility].

## 2023-02-19 NOTE — Progress Notes (Signed)
eLink Physician-Brief Progress Note Patient Name: Eric Arellano DOB: [birth year] MRN: [id number]   Date of Service  02/19/2023  HPI/Events of Note  Having insomnia, requesting sleep aid  eICU Interventions  First-line melatonin, if after 1 hour this fails, can try trazodone second line     Intervention Category Minor Interventions: Routine modifications to care plan (e.g. PRN medications for pain, fever)  Missi Mcmackin 02/19/2023, 7:51 PM

## 2023-02-19 NOTE — Procedures (Addendum)
Patient Name: Eric Arellano  MRN: [id number]  Epilepsy Attending: Charlsie Quest  Referring Physician/Provider: Jefferson Fuel, MD  Duration: 02/18/2023 1550 to 02/19/2023 0902  Patient history:  28 year old gentleman with who presents with new onset seizures who presented and convulsive status epilepticus and is now intubated. EEG to evaluate for seizure   Level of alertness:  comatose  AEDs during EEG study: LEV, Propofol  Technical aspects: This EEG study was done with scalp electrodes positioned according to the 10-20 International system of electrode placement. Electrical activity was reviewed with band pass filter of 1-70Hz , sensitivity of 7 uV/mm, display speed of 25mm/sec with a 60Hz  notched filter applied as appropriate. EEG data were recorded continuously and digitally stored.  Video monitoring was available and reviewed as appropriate.  Description: EEG showed continuous generalized 3 to 6 Hz theta-delta slowing admixed with an excessive amount of 15 to 18 Hz beta activity distributed symmetrically and diffusely. Hyperventilation and photic stimulation were not performed.     ABNORMALITY - Continuous slow, generalized - Excessive beta, generalized  IMPRESSION: This study is suggestive of moderate to severe diffuse encephalopathy likely related to sedation. No seizures or epileptiform discharges were seen throughout the recording.  Sharelle Burditt Annabelle Harman

## 2023-02-20 ENCOUNTER — Other Ambulatory Visit (HOSPITAL_COMMUNITY): Payer: Self-pay

## 2023-02-20 LAB — MISC LABCORP TEST (SEND OUT)
Labcorp test code: 505625
Source (LabCorp): 2

## 2023-02-20 LAB — BASIC METABOLIC PANEL
Anion gap: 8 (ref 5–15)
BUN: 9 mg/dL (ref 6–20)
CO2: 25 mmol/L (ref 22–32)
Calcium: 8.6 mg/dL — ABNORMAL LOW (ref 8.9–10.3)
Chloride: 107 mmol/L (ref 98–111)
Creatinine, Ser: 0.8 mg/dL (ref 0.61–1.24)
GFR, Estimated: >60 mL/min (ref >60)
Glucose, Bld: 93 mg/dL (ref 70–99)
Potassium: 3.5 mmol/L (ref 3.5–5.1)
Sodium: 140 mmol/L (ref 135–145)

## 2023-02-20 LAB — CBC
HCT: 34.6 % — ABNORMAL LOW (ref 39.0–52.0)
Hemoglobin: 12 g/dL — ABNORMAL LOW (ref 13.0–17.0)
MCH: 30.8 pg (ref 26.0–34.0)
MCHC: 34.7 g/dL (ref 30.0–36.0)
MCV: 88.7 fL (ref 80.0–100.0)
Platelets: 240 10*3/uL (ref 150–400)
RBC: 3.9 MIL/uL — ABNORMAL LOW (ref 4.22–5.81)
RDW: 13.1 % (ref 11.5–15.5)
WBC: 11.3 10*3/uL — ABNORMAL HIGH (ref 4.0–10.5)
nRBC: 0 % (ref 0.0–0.2)

## 2023-02-20 LAB — GLUCOSE, CAPILLARY
Glucose-Capillary: 106 mg/dL — ABNORMAL HIGH (ref 70–99)
Glucose-Capillary: 116 mg/dL — ABNORMAL HIGH (ref 70–99)
Glucose-Capillary: 95 mg/dL (ref 70–99)

## 2023-02-20 LAB — PHOSPHORUS: Phosphorus: 3 mg/dL (ref 2.5–4.6)

## 2023-02-20 LAB — MAGNESIUM: Magnesium: 2 mg/dL (ref 1.7–2.4)

## 2023-02-20 MED ORDER — POTASSIUM CHLORIDE CRYS ER 20 MEQ PO TBCR
40.0000 meq | EXTENDED_RELEASE_TABLET | Freq: Once | ORAL | Status: AC
  Administered 2023-02-20: 40 meq via ORAL
  Filled 2023-02-20: qty 2

## 2023-02-20 MED ORDER — DIVALPROEX SODIUM 500 MG PO DR TAB
500.0000 mg | DELAYED_RELEASE_TABLET | Freq: Two times a day (BID) | ORAL | Status: DC
Start: 2023-02-20 — End: 2023-02-20
  Filled 2023-02-20: qty 1

## 2023-02-20 MED ORDER — DIVALPROEX SODIUM 500 MG PO DR TAB
500.0000 mg | DELAYED_RELEASE_TABLET | Freq: Two times a day (BID) | ORAL | 2 refills | Status: DC
  Filled 2023-02-20: qty 60, 30d supply, fill #0

## 2023-02-20 NOTE — Discharge Summary (Signed)
 Physician Discharge Summary  Patient ID: Eric Arellano MRN: [id number] DOB/AGE: 1995-03-06 28 y.o.  Admit date: 02/18/2023 Discharge date: 02/20/2023  Admission Diagnoses: Status epilepticus  Discharge Diagnoses:  Principal Problem:   Status epilepticus [facility])   Discharged Condition: good  Hospital Course: 28 year old man with past history of asthma, tobacco abuse, marijuana use. Recent episodes of loss of consciousness over the past month.  Complained of severe headache on the morning of admission and was found to have a generalized tonic-clonic seizure.  Remained postictal on arrival and was intubated. No further seizures on EEG.  Patient sedation was weaned and patient was extubated.  CT angio, MRI were all negative.  Seen by epileptologist.  Diagnosed with idiopathic epilepsy.  Alternatively, patient has a history of mild head trauma several years ago which may be the inciting event.  Patient is being discharged on Vimpat  50 mg twice daily.  He was advised not to drive and to be careful around heavy machinery and working at heights until reassessed by neurology. He was advised that stimulants, alcohol, other recreational drugs can all alter seizure threshold and that complete abstinence is advisable.  Strict compliance with prescribed anticonvulsants and with neurology follow-up will help to ensure that he will be seizure-free and cleared to drive again.  He should receive a call from [facility] neurology by the end of the week for a follow-up appointment.  The family was advised to call if they are not contacted by 2/10.  Consults: neurology  Significant Diagnostic Studies: radiology: MRI: No acute pathology  Treatments: Anticonvulsant treatment.  Discharge Exam: Blood pressure 127/75, pulse 71, temperature 99 F (37.2 C), temperature source Oral, resp. rate (!) 23, height 5' 5 (1.651 m), weight 69.9 kg, SpO2 91%. General appearance: alert and cooperative Head: Normocephalic,  without obvious abnormality, atraumatic Resp: clear to auscultation bilaterally Cardio: regular rate and rhythm, S1, S2 normal, no murmur, click, rub or gallop Neurologic: Alert and oriented X 3, normal strength and tone. Normal symmetric reflexes. Normal coordination and gait  Disposition:   Discharge Instructions     Ambulatory referral to Neurology   Complete by: As directed    An appointment is requested in approximately: 8-12 weeks      Allergies as of 02/20/2023   No Known Allergies      Medication List     TAKE these medications    divalproex  500 MG DR tablet Commonly known as: DEPAKOTE  Take 1 tablet (500 mg total) by mouth 2 (two) times daily.       45 minutes was spent in day of discharge planning.  SignedBETHA Fredia Alderton 02/20/2023, 9:48 AM

## 2023-02-20 NOTE — Plan of Care (Signed)
  Problem: Activity: Goal: Ability to tolerate increased activity will improve Outcome: Progressing   Problem: Respiratory: Goal: Ability to maintain a clear airway and adequate ventilation will improve Outcome: Progressing   Problem: Role Relationship: Goal: Method of communication will improve Outcome: Progressing   Problem: Education: Goal: Knowledge of General Education information will improve Description: Including pain rating scale, medication(s)/side effects and non-pharmacologic comfort measures Outcome: Progressing   Problem: Health Behavior/Discharge Planning: Goal: Ability to manage health-related needs will improve Outcome: Progressing

## 2023-02-20 NOTE — Care Plan (Signed)
 MRI brain without contrast reviewed.  No acute abnormality.  Has tolerated Vimpat  50 mg twice daily without any seizure recurrence.  Okay to discharge and follow-up with [facility] in 2 to 3 months (order placed).  Discussed plan with Dr. Arlinda.  Eric Arellano Eric Arellano

## 2023-02-20 NOTE — Progress Notes (Signed)
 Subjective: No further seizures overnight  ROS: negative except above Examination  Vital signs in last 24 hours: Temp:  [97.4 F (36.3 C)-99.5 F (37.5 C)] 99 F (37.2 C) (02/04 0800) Pulse Rate:  [44-106] 71 (02/04 0800) Resp:  [11-26] 23 (02/04 0800) BP: (95-140)/(38-89) 127/75 (02/04 0800) SpO2:  [91 %-99 %] 91 % (02/04 0800) Weight:  [69.9 kg] 69.9 kg (02/04 0431)  General: lying in bed, NAD Neuro: MS: Alert, oriented, follows commands CN: pupils equal and reactive,  EOMI, face symmetric, tongue midline, normal sensation over face, Motor: 5/5 strength in all 4 extremities Coordination: normal Gait: not tested  Basic Metabolic Panel: Recent Labs  Lab 02/18/23 1346 02/18/23 1354 02/18/23 1437 02/18/23 1956 02/19/23 0438 02/20/23 0541  NA 135 138  137 139  --  137 140  K 4.3 4.3  4.2 3.8  --  4.1 3.5  CL 106 110  --   --  110 107  CO2 16*  --   --   --  19* 25  GLUCOSE 212* 212*  --   --  127* 93  BUN 15 18  --   --  10 9  CREATININE 0.95 0.90  --   --  0.84 0.80  CALCIUM 8.8*  --   --   --  8.5* 8.6*  MG  --   --   --  2.0  --  2.0  PHOS  --   --   --   --   --  3.0    CBC: Recent Labs  Lab 02/18/23 1346 02/18/23 1354 02/18/23 1437 02/19/23 0438 02/20/23 0541  WBC 30.1*  --   --  14.0* 11.3*  NEUTROABS 25.9*  --   --   --   --   HGB 15.4 16.3  15.6 14.6 13.6 12.0*  HCT 46.2 48.0  46.0 43.0 39.5 34.6*  MCV 91.5  --   --  89.2 88.7  PLT 388  --   --  327 240     Coagulation Studies: No results for input(s): LABPROT, INR in the last 72 hours.  Imaging personally reviewed  MRI brain without contrast 02/19/2023: No acute abnormality.   ASSESSMENT AND PLAN: 28 year old male with past medical history of asthma, nicotine use and marijuana use who presented with new onset status epilepticus which has since resolved.   New onset status epilepticus - No clear etiology.  LP negative for infection.  No acute abnormality on MRI brain.  UDS positive  for marijuana    Recommendations -Patient declined Keppra  due to potential side effects of worsening mood -Unfortunately patient does not have insurance and therefore Vimpat  is unaffordable -Will prescribe Depakote  DR 500 mg twice daily -Seizure precautions including no driving -Discussed plan with Dr. Arlinda via secure chat -Follow-up with neurology in 2 to 3 months (order placed)  I have spent a total of  36  minutes with the patient reviewing hospital notes,  test results, labs and examining the patient as well as establishing an assessment and plan that was discussed personally with the patient.  > 50% of time was spent in direct patient care.       Arlin Krebs Epilepsy [facility] For questions after 5pm please refer to AMION to reach the Neurologist on call

## 2023-02-22 LAB — CSF CULTURE W GRAM STAIN

## 2023-02-23 LAB — CULTURE, BLOOD (ROUTINE X 2)
Culture: NO GROWTH
Culture: NO GROWTH

## 2023-03-08 ENCOUNTER — Other Ambulatory Visit (HOSPITAL_COMMUNITY): Payer: Self-pay

## 2023-03-20 ENCOUNTER — Other Ambulatory Visit (HOSPITAL_COMMUNITY): Payer: Self-pay

## 2023-05-02 ENCOUNTER — Ambulatory Visit (INDEPENDENT_AMBULATORY_CARE_PROVIDER_SITE_OTHER): Payer: Self-pay | Admitting: Neurology

## 2023-05-02 ENCOUNTER — Encounter: Payer: Self-pay | Admitting: Neurology

## 2023-05-02 VITALS — BP 132/74 | HR 84 | Ht 65.0 in | Wt 164.0 lb

## 2023-05-02 DIAGNOSIS — G40909 Epilepsy, unspecified, not intractable, without status epilepticus: Secondary | ICD-10-CM

## 2023-05-02 DIAGNOSIS — Z5181 Encounter for therapeutic drug level monitoring: Secondary | ICD-10-CM

## 2023-05-02 MED ORDER — DIVALPROEX SODIUM 500 MG PO DR TAB: 500.0000 mg | DELAYED_RELEASE_TABLET | Freq: Two times a day (BID) | ORAL | 3 refills | Status: AC

## 2023-05-02 NOTE — Patient Instructions (Signed)
 Continue with Depakote 500 mg twice daily  Will check Depakote level with CMP  Routine EEG  Please call if you have a seizure  Return in 6 months

## 2023-05-02 NOTE — Progress Notes (Signed)
 [facility]  PATIENT: Eric Arellano DOB: [birth year]  REQUESTING CLINICIAN: Charlsie Quest, MD HISTORY FROM: Patient  REASON FOR VISIT: New onset seizure   HISTORICAL  CHIEF COMPLAINT:  Chief Complaint  Patient presents with   New Patient (Initial Visit)    Pt in 12, here alone  Pt is referred by ED for seizures. Pt states his last seizure was in Feb/2025.     HISTORY OF PRESENT ILLNESS:  This is a 28 year old gentleman with no reported past medical history who is presenting after his first seizure on February 2.  Patient remembers getting up in the morning, taking a shower, tells me that he was not feeling well that he described as vivid image of his past and then dizziness, the next thing that he remembers is waking up in the hospital  Per chart review patient did have a seizure at home, EMS was called and in the ED he had another seizure requiring intubation and admission to the ICU.  His workup including LP was unrevealing.  MRI brain was normal, EEG showed diffuse slowing.  He was discharged on Depakote 500 mg twice daily. In retrospect he tells me that for the past 3 years he been having episodes where he does not feel well, his heart will race, feels like he is going to throw up and then he will usually lay on the floor and wait for the episode to pass.  He tells me that he is usually alone, so no one has never witnessed a seizure.  Denies waking up with blood on the pillow, denies waking up with urinary incontinence.  Since being on Depakote he has not had his additional symptoms.  Handedness: Left handed   Onset: February   Seizure Type: Generalized convulsion but he has episodes of strong memory of his past, heart racing, nausea for the past 3 years   Current frequency: Only once, Feb 18 2023  Any injuries from seizures: Denies   Seizure risk factors: Denies   Previous ASMs: None   Currenty ASMs: Depakote 500 mg twice daily   ASMs side  effects: Denies   Brain Images: Normal   Previous EEGs: Diffuse slowing    OTHER MEDICAL CONDITIONS: None   REVIEW OF SYSTEMS: Full 14 system review of systems performed and negative with exception of: As noted in the HPI  ALLERGIES: No Known Allergies  HOME MEDICATIONS: Outpatient Medications Prior to Visit  Medication Sig Dispense Refill   Fexofenadine HCl (ALLERGY 24-HR PO) Take by mouth.     divalproex (DEPAKOTE) 500 MG DR tablet Take 1 tablet (500 mg total) by mouth 2 (two) times daily. 60 tablet 2   No facility-administered medications prior to visit.    PAST MEDICAL HISTORY: Past Medical History:  Diagnosis Date   Asthma    Seizure (HCC)     PAST SURGICAL HISTORY: History reviewed. No pertinent surgical history.  FAMILY HISTORY: History reviewed. No pertinent family history.  SOCIAL HISTORY: Social History   Socioeconomic History   Marital status: Single    Spouse name: Not on file   Number of children: Not on file   Years of education: Not on file   Highest education level: Not on file  Occupational History    Comment: self employed  Tobacco Use   Smoking status: Every Day    Current packs/day: 0.50    Types: Cigarettes   Smokeless tobacco: Never   Tobacco comments:    Smokes sometimes  Vaping  Use   Vaping status: Some Days  Substance and Sexual Activity   Alcohol use: No   Drug use: Yes    Types: Marijuana    Comment: states smokes every day   Sexual activity: Not on file  Other Topics Concern   Not on file  Social History Narrative   Not on file   Social Drivers of Health   Financial Resource Strain: Not on file  Food Insecurity: Not on file  Transportation Needs: Not on file  Physical Activity: Not on file  Stress: Not on file  Social Connections: Not on file  Intimate Partner Violence: Not on file    PHYSICAL EXAM  GENERAL EXAM/CONSTITUTIONAL: Vitals:  Vitals:   05/02/23 0819  BP: 132/74  Pulse: 84  Weight: 164 lb (74.4  kg)  Height: 5\' 5"  (1.651 m)   Body mass index is 27.29 kg/m. Wt Readings from Last 3 Encounters:  05/02/23 164 lb (74.4 kg)  02/20/23 154 lb 1.6 oz (69.9 kg)  05/14/20 153 lb (69.4 kg)   Patient is in no distress; well developed, nourished and groomed; neck is supple  MUSCULOSKELETAL: Gait, strength, tone, movements noted in Neurologic exam below  NEUROLOGIC: MENTAL STATUS:      No data to display         awake, alert, oriented to person, place and time recent and remote memory intact normal attention and concentration language fluent, comprehension intact, naming intact fund of knowledge appropriate  CRANIAL NERVE:  2nd, 3rd, 4th, 6th - Visual fields full to confrontation, extraocular muscles intact, no nystagmus 5th - facial sensation symmetric 7th - facial strength symmetric 8th - hearing intact 9th - palate elevates symmetrically, uvula midline 11th - shoulder shrug symmetric 12th - tongue protrusion midline  MOTOR:  normal bulk and tone, full strength in the BUE, BLE  SENSORY:  normal and symmetric to light touch  COORDINATION:  finger-nose-finger, fine finger movements normal  GAIT/STATION:  normal   DIAGNOSTIC DATA (LABS, IMAGING, TESTING) - I reviewed patient records, labs, notes, testing and imaging myself where available.  Lab Results  Component Value Date   WBC 11.3 (H) 02/20/2023   HGB 12.0 (L) 02/20/2023   HCT 34.6 (L) 02/20/2023   MCV 88.7 02/20/2023   PLT 240 02/20/2023      Component Value Date/Time   NA 140 02/20/2023 0541   K 3.5 02/20/2023 0541   CL 107 02/20/2023 0541   CO2 25 02/20/2023 0541   GLUCOSE 93 02/20/2023 0541   BUN 9 02/20/2023 0541   CREATININE 0.80 02/20/2023 0541   CALCIUM 8.6 (L) 02/20/2023 0541   PROT 7.5 02/18/2023 1346   ALBUMIN 4.3 02/18/2023 1346   AST 26 02/18/2023 1346   ALT 17 02/18/2023 1346   ALKPHOS 111 02/18/2023 1346   BILITOT 0.9 02/18/2023 1346   GFRNONAA >60 02/20/2023 0541   GFRAA >60  09/10/2015 1624   No results found for: "CHOL", "HDL", "LDLCALC", "LDLDIRECT", "TRIG" Lab Results  Component Value Date   HGBA1C 5.8 (H) 02/18/2023   No results found for: "VITAMINB12" No results found for: "TSH"  MRI Brain 02/19/2023 No acute intracranial process. No seizure focus is identified.   EEG 02/19/2023 - Continuous slow, generalized - Excessive beta, generalized  I personally reviewed brain Images and previous EEG reports.   ASSESSMENT AND PLAN  28 y.o. year old male  with no reported past medical history who is presenting after back-to-back generalized convulsion on February 2, status epilepticus, requiring intubation and  admission to the ICU.  Since discharge she has been on Depakote 500 mg twice daily, has not had any seizures.   He tells me for the past 3 years has episodes of vivid memory of his past, associated with heart racing, nausea and not feeling well.  Those events were potentially focal seizures, agree with starting antiseizure medication.  His previous EEG showed generalized slowing, most likely from medications in the ICU.  Will repeat the EEG.  I will contact him to go over the results.  I will see him in 6 months for follow-up or sooner if worse.  He understands to contact me if he does have a breakthrough seizure and understands not to drive for a total of 6 months.    1. Seizure disorder (HCC)   2. Therapeutic drug monitoring     Patient Instructions  Continue with Depakote 500 mg twice daily  Will check Depakote level with CMP  Routine EEG  Please call if you have a seizure  Return in 6 months    Per [facility] statutes, patients with seizures are not allowed to drive until they have been seizure-free for six months.  Other recommendations include using caution when using heavy equipment or power tools. Avoid working on ladders or at heights. Take showers instead of baths.  Do not swim alone.  Ensure the water temperature is not too high on  the home water heater. Do not go swimming alone. Do not lock yourself in a room alone (i.e. bathroom). When caring for infants or small children, sit down when holding, feeding, or changing them to minimize risk of injury to the child in the event you have a seizure. Maintain good sleep hygiene. Avoid alcohol.  Also recommend adequate sleep, hydration, good diet and minimize stress.   During the Seizure  - First, ensure adequate ventilation and place patients on the floor on their left side  Loosen clothing around the neck and ensure the airway is patent. If the patient is clenching the teeth, do not force the mouth open with any object as this can cause severe damage - Remove all items from the surrounding that can be hazardous. The patient may be oblivious to what's happening and may not even know what he or she is doing. If the patient is confused and wandering, either gently guide him/her away and block access to outside areas - Reassure the individual and be comforting - Call 911. In most cases, the seizure ends before EMS arrives. However, there are cases when seizures may last over 3 to 5 minutes. Or the individual may have developed breathing difficulties or severe injuries. If a pregnant patient or a person with diabetes develops a seizure, it is prudent to call an ambulance. - Finally, if the patient does not regain full consciousness, then call EMS. Most patients will remain confused for about 45 to 90 minutes after a seizure, so you must use judgment in calling for help. - Avoid restraints but make sure the patient is in a bed with padded side rails - Place the individual in a lateral position with the neck slightly flexed; this will help the saliva drain from the mouth and prevent the tongue from falling backward - Remove all nearby furniture and other hazards from the area - Provide verbal assurance as the individual is regaining consciousness - Provide the patient with privacy if  possible - Call for help and start treatment as ordered by the caregiver   After the  Seizure (Postictal Stage)  After a seizure, most patients experience confusion, fatigue, muscle pain and/or a headache. Thus, one should permit the individual to sleep. For the next few days, reassurance is essential. Being calm and helping reorient the person is also of importance.  Most seizures are painless and end spontaneously. Seizures are not harmful to others but can lead to complications such as stress on the lungs, brain and the heart. Individuals with prior lung problems may develop labored breathing and respiratory distress.    Discussed Patients with epilepsy have a small risk of sudden unexpected death, a condition referred to as sudden unexpected death in epilepsy (SUDEP). SUDEP is defined specifically as the sudden, unexpected, witnessed or unwitnessed, nontraumatic and nondrowning death in patients with epilepsy with or without evidence for a seizure, and excluding documented status epilepticus, in which post mortem examination does not reveal a structural or toxicologic cause for death     Orders Placed This Encounter  Procedures   Valproic Acid Level   CMP   EEG adult    Meds ordered this encounter  Medications   divalproex (DEPAKOTE) 500 MG DR tablet    Sig: Take 1 tablet (500 mg total) by mouth 2 (two) times daily.    Dispense:  180 tablet    Refill:  3    Return in about 6 months (around 11/01/2023).    Cassandra Cleveland, MD 05/02/2023, 8:49 AM  [facility] [address] [place], [place] [address] [phone]

## 2023-05-03 ENCOUNTER — Telehealth: Payer: Self-pay

## 2023-05-03 LAB — COMPREHENSIVE METABOLIC PANEL WITH GFR
ALT: 46 IU/L — ABNORMAL HIGH (ref 0–44)
AST: 34 IU/L (ref 0–40)
Albumin: 4.4 g/dL (ref 4.3–5.2)
Alkaline Phosphatase: 80 IU/L (ref 44–121)
BUN/Creatinine Ratio: 17 (ref 9–20)
BUN: 14 mg/dL (ref 6–20)
Bilirubin Total: 0.2 mg/dL (ref 0.0–1.2)
CO2: 22 mmol/L (ref 20–29)
Calcium: 8.9 mg/dL (ref 8.7–10.2)
Chloride: 108 mmol/L — ABNORMAL HIGH (ref 96–106)
Creatinine, Ser: 0.82 mg/dL (ref 0.76–1.27)
Globulin, Total: 2.3 g/dL (ref 1.5–4.5)
Glucose: 89 mg/dL (ref 70–99)
Potassium: 4.4 mmol/L (ref 3.5–5.2)
Sodium: 142 mmol/L (ref 134–144)
Total Protein: 6.7 g/dL (ref 6.0–8.5)
eGFR: 122 mL/min/{1.73_m2} (ref >59)

## 2023-05-03 LAB — VALPROIC ACID LEVEL: Valproic Acid Lvl: 32 ug/mL — ABNORMAL LOW (ref 50–100)

## 2023-05-03 NOTE — Telephone Encounter (Signed)
-----   Message from [facility] sent at 05/03/2023 12:44 PM EDT ----- Please call and advise the patient that the recent labs we checked showed a slight low depakote level. Please advise patient to take the Depakote as scheduled. No further action is required on these tests at this time. Please remind patient to keep any upcoming appointments or tests and to call us  with any interim questions, concerns, problems or updates. Thanks,   Cassandra Cleveland, MD

## 2023-05-03 NOTE — Progress Notes (Signed)
 Please call and advise the patient that the recent labs we checked showed a slight low depakote level. Please advise patient to take the Depakote as scheduled. No further action is required on these tests at this time. Please remind patient to keep any upcoming appointments or tests and to call us  with any interim questions, concerns, problems or updates. Thanks,   Cassandra Cleveland, MD

## 2023-05-07 ENCOUNTER — Ambulatory Visit (INDEPENDENT_AMBULATORY_CARE_PROVIDER_SITE_OTHER): Payer: Self-pay | Admitting: Neurology

## 2023-05-07 DIAGNOSIS — G40909 Epilepsy, unspecified, not intractable, without status epilepticus: Secondary | ICD-10-CM

## 2023-05-07 NOTE — Procedures (Signed)
    History:  28 year old man with seizure   EEG classification: Awake and drowsy  Duration: 25 minutes   Technical aspects: This EEG study was done with scalp electrodes positioned according to the 10-20 International system of electrode placement. Electrical activity was reviewed with band pass filter of 1-70Hz , sensitivity of 7 uV/mm, display speed of 49mm/sec with a 60Hz  notched filter applied as appropriate. EEG data were recorded continuously and digitally stored.   Description of the recording: The background rhythms of this recording consists of a fairly well modulated medium amplitude alpha rhythm of 10 Hz that is reactive to eye opening and closure. Present in the anterior head region is a 15-20 Hz beta activity. Photic stimulation was performed, did not show any abnormalities. Hyperventilation was also performed, did not show any abnormalities. Drowsiness was not seen. No abnormal epileptiform discharges seen during this recording. There was no focal slowing. There were no electrographic seizure identified.   Abnormality: None   Impression: This is a normal awake EEG. No evidence of interictal epileptiform discharges. Normal EEGs, however, do not rule out epilepsy.    Keyandra Swenson, MD [facility]

## 2023-05-09 ENCOUNTER — Telehealth: Payer: Self-pay

## 2023-05-09 NOTE — Telephone Encounter (Signed)
-----   Message from [facility] sent at 05/09/2023  8:09 AM EDT ----- Please call and inform patient that his recent EEG (Brain wave test) was normal. In particular, there were no epileptiform discharges and no seizures. No further action is required on this test at this time. Please keep any upcoming appointments or tests and  call us  with any interim questions, concerns, problems or updates. Thanks,   Cassandra Cleveland, MD

## 2023-05-09 NOTE — Telephone Encounter (Signed)
 Lvm 1st attempt by hf 05/09/23

## 2023-05-09 NOTE — Progress Notes (Signed)
Please call and inform patient that his recent EEG (Brain wave test) was normal. In particular, there were no epileptiform discharges and no seizures. No further action is required on this test at this time. Please keep any upcoming appointments or tests and  call us with any interim questions, concerns, problems or updates. Thanks,   Emmie Frakes, MD 

## 2023-11-13 ENCOUNTER — Encounter: Payer: Self-pay | Admitting: Neurology

## 2023-11-13 ENCOUNTER — Ambulatory Visit: Payer: MEDICAID | Admitting: Neurology
